# Patient Record
Sex: Female | Born: 1965 | Race: Black or African American | Hispanic: No | Marital: Single | Smoking: Never smoker
Health system: Southern US, Community
[De-identification: ages and names within clinical notes are randomized; demographics above are authoritative.]

## PROBLEM LIST (undated history)

## (undated) DIAGNOSIS — G4733 Obstructive sleep apnea (adult) (pediatric): Secondary | ICD-10-CM

## (undated) DIAGNOSIS — M419 Scoliosis, unspecified: Secondary | ICD-10-CM

## (undated) DIAGNOSIS — Z9989 Dependence on other enabling machines and devices: Secondary | ICD-10-CM

## (undated) DIAGNOSIS — F329 Major depressive disorder, single episode, unspecified: Secondary | ICD-10-CM

## (undated) DIAGNOSIS — J45909 Unspecified asthma, uncomplicated: Secondary | ICD-10-CM

## (undated) DIAGNOSIS — N809 Endometriosis, unspecified: Secondary | ICD-10-CM

## (undated) DIAGNOSIS — R569 Unspecified convulsions: Secondary | ICD-10-CM

## (undated) DIAGNOSIS — Z8619 Personal history of other infectious and parasitic diseases: Secondary | ICD-10-CM

## (undated) DIAGNOSIS — E079 Disorder of thyroid, unspecified: Secondary | ICD-10-CM

## (undated) DIAGNOSIS — K219 Gastro-esophageal reflux disease without esophagitis: Secondary | ICD-10-CM

## (undated) DIAGNOSIS — K635 Polyp of colon: Secondary | ICD-10-CM

## (undated) DIAGNOSIS — E785 Hyperlipidemia, unspecified: Secondary | ICD-10-CM

## (undated) DIAGNOSIS — F419 Anxiety disorder, unspecified: Secondary | ICD-10-CM

## (undated) DIAGNOSIS — I10 Essential (primary) hypertension: Secondary | ICD-10-CM

## (undated) DIAGNOSIS — F32A Depression, unspecified: Secondary | ICD-10-CM

## (undated) HISTORY — DX: Polyp of colon: K63.5

## (undated) HISTORY — PX: OTHER SURGICAL HISTORY: SHX169

## (undated) HISTORY — DX: Endometriosis, unspecified: N80.9

## (undated) HISTORY — DX: Essential (primary) hypertension: I10

## (undated) HISTORY — DX: Obstructive sleep apnea (adult) (pediatric): G47.33

## (undated) HISTORY — DX: Hyperlipidemia, unspecified: E78.5

## (undated) HISTORY — DX: Unspecified asthma, uncomplicated: J45.909

## (undated) HISTORY — DX: Disorder of thyroid, unspecified: E07.9

## (undated) HISTORY — DX: Gastro-esophageal reflux disease without esophagitis: K21.9

## (undated) HISTORY — DX: Unspecified convulsions: R56.9

## (undated) HISTORY — DX: Major depressive disorder, single episode, unspecified: F32.9

## (undated) HISTORY — DX: Anxiety disorder, unspecified: F41.9

## (undated) HISTORY — DX: Depression, unspecified: F32.A

## (undated) HISTORY — DX: Personal history of other infectious and parasitic diseases: Z86.19

## (undated) HISTORY — DX: Dependence on other enabling machines and devices: Z99.89

---

## 1999-10-22 ENCOUNTER — Other Ambulatory Visit: Admission: RE | Admit: 1999-10-22 | Discharge: 1999-10-22 | Payer: Self-pay | Admitting: *Deleted

## 2001-05-20 HISTORY — PX: ABDOMINAL HYSTERECTOMY: SHX81

## 2001-11-09 ENCOUNTER — Other Ambulatory Visit: Admission: RE | Admit: 2001-11-09 | Discharge: 2001-11-09 | Payer: Self-pay | Admitting: *Deleted

## 2001-12-30 ENCOUNTER — Ambulatory Visit (HOSPITAL_COMMUNITY): Admission: RE | Admit: 2001-12-30 | Discharge: 2001-12-30 | Payer: Self-pay | Admitting: *Deleted

## 2002-04-13 ENCOUNTER — Inpatient Hospital Stay (HOSPITAL_COMMUNITY): Admission: RE | Admit: 2002-04-13 | Discharge: 2002-04-16 | Payer: Self-pay | Admitting: *Deleted

## 2002-04-13 ENCOUNTER — Encounter (INDEPENDENT_AMBULATORY_CARE_PROVIDER_SITE_OTHER): Payer: Self-pay | Admitting: *Deleted

## 2006-05-20 HISTORY — PX: BREAST BIOPSY: SHX20

## 2006-06-13 ENCOUNTER — Encounter: Admission: RE | Admit: 2006-06-13 | Discharge: 2006-06-13 | Payer: Self-pay | Admitting: General Surgery

## 2006-06-19 ENCOUNTER — Encounter: Admission: RE | Admit: 2006-06-19 | Discharge: 2006-06-19 | Payer: Self-pay | Admitting: General Surgery

## 2006-06-30 ENCOUNTER — Encounter: Admission: RE | Admit: 2006-06-30 | Discharge: 2006-06-30 | Payer: Self-pay | Admitting: General Surgery

## 2006-06-30 ENCOUNTER — Encounter (INDEPENDENT_AMBULATORY_CARE_PROVIDER_SITE_OTHER): Payer: Self-pay | Admitting: *Deleted

## 2006-07-14 ENCOUNTER — Encounter: Admission: RE | Admit: 2006-07-14 | Discharge: 2006-07-14 | Payer: Self-pay | Admitting: Endocrinology

## 2007-02-03 ENCOUNTER — Encounter: Admission: RE | Admit: 2007-02-03 | Discharge: 2007-02-03 | Payer: Self-pay | Admitting: General Surgery

## 2008-06-23 ENCOUNTER — Encounter: Admission: RE | Admit: 2008-06-23 | Discharge: 2008-06-23 | Payer: Self-pay | Admitting: Emergency Medicine

## 2009-05-20 HISTORY — PX: MASTOIDECTOMY: SHX711

## 2009-05-20 HISTORY — PX: OVARIAN CYST REMOVAL: SHX89

## 2009-05-20 HISTORY — PX: MASTOIDECTOMY: SUR855

## 2009-10-25 ENCOUNTER — Encounter: Admission: RE | Admit: 2009-10-25 | Discharge: 2009-10-25 | Payer: Self-pay | Admitting: Otolaryngology

## 2009-11-03 ENCOUNTER — Encounter: Admission: RE | Admit: 2009-11-03 | Discharge: 2009-11-03 | Payer: Self-pay | Admitting: Otolaryngology

## 2010-06-10 ENCOUNTER — Encounter: Payer: Self-pay | Admitting: Otolaryngology

## 2010-10-05 NOTE — Discharge Summary (Signed)
   NAME:  Karen Foley, LANDGREN                        ACCOUNT NO.:  [id number]   MEDICAL RECORD NO.:  [id number]                   PATIENT TYPE:  INP   LOCATION:  9118                                 FACILITY:  [facility]   PHYSICIAN:  Georgina Peer, M.D.              DATE OF BIRTH:  [birth year]   DATE OF ADMISSION:  04/13/2002  DATE OF DISCHARGE:  04/16/2002                                 DISCHARGE SUMMARY   PREOPERATIVE DIAGNOSES:  1. Endometriosis.  2. Pelvic pain.  3. Chronic pelvic inflammatory disease.   DISCHARGE DIAGNOSES:  1. Endometriosis.  2. Pelvic pain.  3. Chronic pelvic inflammatory disease.  4. Compensated anemia.   BRIEF HISTORY:  A 45 year old black female with a 20-year history of  endometriosis was brought in for a TAHBSO which was performed on April 13, 2002 without incident.  There were extensive abdominal and pelvic  adhesions and extensive enterolysis between the intestinal and pelvic  organs.  There was no intestinal damage.  Postoperatively, the patient did  well.  She had no nausea or vomiting. An Alpha-200 incisional catheter was  placed into the incision and gave catheter analgesia.  She had a T-max of  101 on the evening of surgery but that come down to normal.  Her vital signs  were stable.  Her lungs were clear.  Her abdomen was flat.  She had bowel  sounds present on the first day.  Some serosanguineous drainage from the  incision and vagina resolved within the first day.  Initial hemoglobin on  the first day was 9.7 from 15.6 preop.  On the second day, it was 10.1.  WBC  was 11.0.  Her bladder emptied satisfactorily when the Foley was removed.  She passed flatus on the second day and was advanced to solid food.  By the  third day, she was eating, ambulating, and voiding without difficulty.  Her  Alpha-200 catheter was removed.  She was taking Percocet for pain.   CONDITION ON DISCHARGE:  She was discharged in good condition.  Her staples  were  removed.   DISCHARGE INSTRUCTIONS:  She was given a discharge instruction sheet and  instructions to avoid heavy lifting, any vaginal entrance, and told to call  for appropriate postoperative complications.   FOLLOW UP:  She will be seen in the office in two weeks.                                               Georgina Peer, M.D.    JPN/MEDQ  D:  04/16/2002  T:  04/16/2002  Job:  [id number]

## 2010-10-05 NOTE — Op Note (Signed)
NAME:  Karen Foley, Karen Foley                        ACCOUNT NO.:  [id number]   MEDICAL RECORD NO.:  [id number]                   PATIENT TYPE:  INP   LOCATION:  9198                                 FACILITY:  [facility]   PHYSICIAN:  Georgina Peer, M.D.              DATE OF BIRTH:  April 04, 1966   DATE OF PROCEDURE:  04/13/2002  DATE OF DISCHARGE:                                 OPERATIVE REPORT   PREOPERATIVE DIAGNOSES:  1. Pelvic adhesions.  2. Pelvic pain.  3. History of endometriosis.   POSTOPERATIVE DIAGNOSES:  1. Pelvic adhesions.  2. Pelvic pain.  3. History of endometriosis.   PROCEDURE:  Total abdominal hysterectomy, bilateral salpingo-oophorectomy,  and lysis of adhesions.   SURGEON:  Georgina Peer, M.D.   ASSISTANT:  Rudy Jew. Ashley Royalty, M.D.   ESTIMATED BLOOD LOSS:  500 cc.   ANESTHESIA:  General.   COMPLICATIONS:  None.   FINDINGS:  Extensive adhesions of the transverse colon to the posterior  uterus.  Adhesions of the rectosigmoid to cul-de-sac and ovaries.  Adnexa  adherent to cul-de-sac.   INDICATIONS:  A 45 year old black female, gravida 0, with a 20-year history  of endometriosis, previous bilateral ovarian cystectomies, several  laparoscopies, the last one in August 2003 showing obliteration of the cul-  de-sac with extensive pelvic adhesions binding the ovaries and adnexal  structures into the cul-de-sac. The patient underwent a bowel prep  preoperatively and is presenting for a total abdominal hysterectomy,  bilateral salpingo-oophorectomy.   DESCRIPTION OF PROCEDURE:  She was taken to the operating room.  The  abdomen, perineum, and vagina were prepped and draped, a Foley catheter  placed in her bladder.  A transverse incision was made in the skin over the  previous Pfannenstiel incision.  This was taken down layer by layer into the  peritoneal cavity, which was opened sharply and widened sharply.  Bleeders  were cauterized.  The patient was placed in  Trendelenburg.  Pelvic findings  were as follows:  There was transverse colon adherent to the posterior  fundal area of the uterus.  There was rectosigmoid adherent to the cul-de-  sac and adnexal structures.  The adnexal structures were not immediately  visible.  There were no adhesions to the anterior abdominal wall.  A Balfour  retractor was placed and the upper bowel was packed away.  Using sharp  dissection, the adhesions of the transverse colon were taken off the uterine  fundus.  The adnexal structures were freed from the pelvic sidewall and from  the rectosigmoid, the rectosigmoid freed from the right adnexa and the cul-  de-sac.  There were no major bleeding episodes.  The ureters were identified  bilaterally.  At this point the round ligaments bilaterally were clamped and  cut, suture ligated.  The infundibulopelvic ligaments bilaterally were  identified free from the ureters.  They were clamped, cut, and suture  ligated.  The  adnexal structures were freed from the pelvic sidewall and  bleeders cauterized.  An anterior bladder flap was created, the uterine  vessels identified, skeletonized bilaterally, then cut and suture ligated.  Progressive bites of the broad and cardinal ligaments to the level of the  uterosacrals were taken and the pedicles suture ligated.  The uterosacrals  bilaterally clamped, cut, and suture ligated.  The vagina was entered at  this point, and the cervix was removed with Satinsky scissors.  The vaginal  cuff was sutured in the corners and whipstitched in the middle with the  midportion left open.  Small bleeders were cauterized.  Again the ureters  were identified.  The pelvis was irrigated.  All packs were removed as well  as retractors, the fascia closed with Vicryl suture.  An Alpha-200  subcutaneous pain management pump was placed with two catheters, one coming  in on the right and one on the left.  The area on the right had small  bleeding from the  catheter site, which was controlled with pressure.  Fifty  cubic centimeters of 0.25% Marcaine injected under the skin.  The Alpha  catheters were attached to the pump.  The skin was closed with skin staples.  Sponge, needle, and instrument counts were correct.  The patient received 1  g of Ancef preoperatively and will continue with Ancef for 24 hours.                                                Georgina Peer, M.D.    JPN/MEDQ  D:  04/13/2002  T:  04/13/2002  Job:  [id number]

## 2010-10-05 NOTE — Op Note (Signed)
NAME:  Karen Foley, Karen Foley                        ACCOUNT NO.:  [id number]   MEDICAL RECORD NO.:  [id number]                   PATIENT TYPE:  AMB   LOCATION:  SDC                                  FACILITY:  [facility]   PHYSICIAN:  Georgina Peer, M.D.              DATE OF BIRTH:  March 03, 1966   DATE OF PROCEDURE:  12/30/2001  DATE OF DISCHARGE:                                 OPERATIVE REPORT   PREOPERATIVE DIAGNOSIS:  Dysmenorrhea and endometriosis.   POSTOPERATIVE DIAGNOSES:  1. Dysmenorrhea and endometriosis.  2. Chronic pelvic inflammatory disease and adhesions.   PROCEDURE:  Laparoscopy with lysis of adhesions.   SURGEON:  Georgina Peer, M.D.   ANESTHESIA:  General.   ESTIMATED BLOOD LOSS:  Less than 25 cc.   COMPLICATIONS:  None.   FINDINGS:  Extensive pelvic adhesions involving bowel and pelvic organs and  obliteration of cul-de-sac.   INDICATIONS:  [age]-year-old black female with a history of  endometriosis dating back to 39 recently on Danocrine but with increasing  dysmenorrhea.  The patient is brought in for diagnostic and possible  therapeutic evaluation with laparoscopy.   DESCRIPTION OF PROCEDURE:  After informed consent, the patient was taken to  the operating room and given a general anesthetic, placed in the dorsal  lithotomy position.  The abdomen, perineum and vagina were prepped and  draped and the bladder emptied with the catheter.  Marcaine 0.25% 2 cc were  injected into the subumbilical, suprapubic, and right lower quadrant sites.  After anesthesia, an incision was made subumbilically.  Verres needle placed  and 2.2. liters of carbon dioxide gas insufflated under low pressure  creating a pneumoperitoneum.  Laparoscopic trocar and sleeve were introduced  and under direct vision a suprapubic and right lower quadrant trocar were  introduced.  The following pelvic findings were noted.  There appeared to be  no injury from the laparoscopic trocar  placement.  There were adhesions from  the small and large bowel to the pelvic side wall, posterior uterus, draping  over the ovaries.  The ovaries bilaterally appeared to be densely adherent  into the cul-de-sac.  The right ovary could be identified, the left ovary  could not be identified.  Using a harmonic scalpel, the lesions were lysed  from the posterior small bowel but the those around the cul-de-sac could not  be freed adequately.  The right ovary was freed to a certain extent,  however, could not be completely freed because of involvement over the  ureter and dense adhesions to the pelvic side wall and uterus.  After all  adhesions that could be lysed were lysed,  photodocumentation was taken.  The procedure was terminated.  The ports were  removed via the laparoscope, and the incisions were closed with subcuticular  and interrupted sutures of Dexon.  Sponge, needle and instrument counts were  correct.  The patient received Toradol postoperatively and was  returned to  the recovery area in good condition.                                               Georgina Peer, M.D.    JPN/MEDQ  D:  12/30/2001  T:  12/31/2001  Job:  [id number]

## 2010-10-05 NOTE — H&P (Signed)
   NAME:  Karen Foley, Karen Foley                        ACCOUNT NO.:  [id number]   MEDICAL RECORD NO.:  [id number]                   PATIENT TYPE:  AMB   LOCATION:  SDC                                  [facility]ACILITY:  [facility]   PHYSICIAN:  Georgina Peer, M.D.              DATE OF BIRTH:  [birth year]   DATE OF ADMISSION:  12/30/2001  DATE OF DISCHARGE:                                HISTORY & PHYSICAL   PREOPERATIVE DIAGNOSES:  1. History of endometriosis on Danocrine.  2. Continued dysmenorrhea.  3. Pelvic pain.   HISTORY OF PRESENT ILLNESS:  The patient is a 45 year old African American  female with a known history of endometriosis.  The patient had been on  Danocrine for two years.  She had had diagnosis of endometriosis in the  1990s and a diagnosis of endometriosis initially in 1983 when she was  diagnosed by laparoscopy.  In 1983, she also underwent an exploratory  laparotomy and bilateral ovarian cystectomies.  She has had no further  operative evaluation of this, however, her dysmenorrhea and pain have  worsened and she is admitted for reevaluation by laparoscopy.   PAST MEDICAL HISTORY:  Significant for the above-mentioned endometriosis.  The patient has no ongoing medical history and no other surgical history.  The patient has a remote history of seizures as a child.  She had been on  Yasmin for cycle control.   REVIEW OF SYSTEMS:  Otherwise negative.  The patient is currently not  sexually active.   SOCIAL HISTORY:  The patient is a nonsmoker.   PHYSICAL EXAMINATION:  GENERAL APPEARANCE:  A well-developed African  American female.  WEIGHT: 175.2 pounds.  VITAL SIGNS:  Blood pressure 120/80.  HEENT:  Normal.  NECK:  Without masses.  LUNGS:  Clear.  HEART:  Regular rate and rhythm.  ABDOMEN:  Soft and nontender.  PELVIC:  External genitalia are normal.  Vagina normal.  Cervix normal.  There is tenderness in the adnexal areas, but no mass.   ADMITTING IMPRESSION:  History of  endometriosis with dysmenorrhea.    PLAN:  The patient is scheduled for diagnostic laparoscopy.  The risks and  complications of the procedure were detailed and explained to the patient  and all questions answered.  The surgery is scheduled for 2:45 p.m. at  [facility].                                               Georgina Peer, M.D.    JPN/MEDQ  D:  12/30/2001  T:  12/30/2001  Job:  [id number]   cc:   Day Surgery

## 2010-10-05 NOTE — H&P (Signed)
NAME:  Karen Foley, Karen Foley                        ACCOUNT NO.:  [id number]   MEDICAL RECORD NO.:  [id number]                   PATIENT TYPE:  INP   LOCATION:  NA                                   FACILITY:  [facility]   PHYSICIAN:  Georgina Peer, M.D.              DATE OF BIRTH:  21-May-1965   DATE OF ADMISSION:  04/13/2002  DATE OF DISCHARGE:                                HISTORY & PHYSICAL   ADMISSION DIAGNOSES:  1. Endometriosis.  2. Chronic pelvic inflammatory disease with pelvic adhesions.  3. Dysmenorrhea.   HISTORY OF PRESENT ILLNESS:  The patient is a 45 year old black female with  a history of endometriosis, dating back to 46 when she underwent a  diagnostic laparoscopy.  Since then she has had intermittent treatment with  oral contraceptives and Danocrine and a recent laparoscopy in August of 2003  revealed severe adhesions in the cul-de-sac with involving the bowel and  obliteration of the cul-de-sac.  The patient is brought in for TAH-BSO.   PAST MEDICAL HISTORY:  The patient has never been pregnant.  The patient has  had multiple laparoscopies, bilateral ovarian cystectomies, diagnostic  laparoscopy, and has had a history of childhood seizures, has never had a  sexually transmitted disease.   ALLERGIES:  None.   HABITS:  Cigarettes:  None.  Ethanol:  None.   FAMILY HISTORY:  Not significant for any pelvic disease.  Hypertension in  father and mother.  Mother with mastectomy.  Diabetes in father.   REVIEW OF SYSTEMS:  Otherwise negative.   PHYSICAL EXAMINATION:  VITAL SIGNS:  Blood pressure is 120/80, weight 135.2.  HEENT:  Grossly normal.  NECK:  Without thyromegaly.  LUNGS:  Clear.  HEART:  Regular rate and rhythm.  BREASTS:  Without masses.  ABDOMEN:  Soft with lower abdominal tenderness.  No CVA tenderness.  No  mass.  No guarding or rebound.  EXTERNAL GENITALS:  Vagina and cervix normal.  There is tenderness in the  cul-de-sac and bilaterally in the adnexal  areas.  The uterus is normal size,  shape, and consistency.  PERIPHERAL VASCULAR:  Normal.  EXTREMITIES:  No cyanosis or edema in the extremities.  NEUROLOGICAL:  Grossly normal.   IMPRESSION:  1. Chronic pelvic inflammatory disease.  2. Endometriosis.  3. Unresponsive to conservative management.   RECOMMENDATIONS:  The patient is admitted for total abdominal hysterectomy,  bilateral salpingo-oophorectomy.  A preoperative bowel prep is being  obtained.  The patient is aware that hormone replacement therapy may be  needed if the patient is symptomatic, and that all her pain may not be  completely dissolved by this procedure.  She is still willing to proceed.  She is aware of risks and complications, bowel, bladder, vascular injury,  neurologic injury, blood clots, and wound or vaginal infections.  Georgina Peer, M.D.    JPN/MEDQ  D:  04/12/2002  T:  04/12/2002  Job:  [id number]   cc:   Day Surgery - [facility]

## 2010-11-13 ENCOUNTER — Other Ambulatory Visit: Payer: Self-pay | Admitting: Emergency Medicine

## 2011-06-18 ENCOUNTER — Other Ambulatory Visit: Payer: Self-pay | Admitting: Family Medicine

## 2011-06-18 ENCOUNTER — Other Ambulatory Visit: Payer: Self-pay | Admitting: Physician Assistant

## 2011-07-02 ENCOUNTER — Ambulatory Visit (INDEPENDENT_AMBULATORY_CARE_PROVIDER_SITE_OTHER): Payer: 59 | Admitting: Internal Medicine

## 2011-07-02 VITALS — BP 145/98 | HR 65 | Temp 98.2°F | Resp 16 | Ht 65.5 in | Wt 190.6 lb

## 2011-07-02 DIAGNOSIS — J019 Acute sinusitis, unspecified: Secondary | ICD-10-CM

## 2011-07-02 DIAGNOSIS — R059 Cough, unspecified: Secondary | ICD-10-CM

## 2011-07-02 DIAGNOSIS — J329 Chronic sinusitis, unspecified: Secondary | ICD-10-CM

## 2011-07-02 DIAGNOSIS — IMO0001 Reserved for inherently not codable concepts without codable children: Secondary | ICD-10-CM

## 2011-07-02 DIAGNOSIS — B09 Unspecified viral infection characterized by skin and mucous membrane lesions: Secondary | ICD-10-CM

## 2011-07-02 DIAGNOSIS — R05 Cough: Secondary | ICD-10-CM

## 2011-07-02 DIAGNOSIS — B001 Herpesviral vesicular dermatitis: Secondary | ICD-10-CM

## 2011-07-02 DIAGNOSIS — M791 Myalgia, unspecified site: Secondary | ICD-10-CM

## 2011-07-02 MED ORDER — VALACYCLOVIR HCL 1 G PO TABS: 1000.0000 mg | ORAL_TABLET | Freq: Three times a day (TID) | ORAL | Status: DC

## 2011-07-02 MED ORDER — MELOXICAM 7.5 MG PO TABS: 7.5000 mg | ORAL_TABLET | Freq: Every day | ORAL | Status: DC

## 2011-07-02 MED ORDER — SULFAMETHOXAZOLE-TRIMETHOPRIM 800-160 MG PO TABS: 1.0000 | ORAL_TABLET | Freq: Two times a day (BID) | ORAL | Status: AC

## 2011-07-02 NOTE — Progress Notes (Signed)
  Subjective:    Patient ID: Karen Foley, female    DOB: [birth year], 46 y.o.   MRN: [id number]  HPI body aches, cough, sinus fullness and drainage. No fever. onset 3 days ago. Now has lesion on her upper lip very painful 8/10 no drainage. No stiff neck no headache no radiation of pain from lip    Review of Systems  Constitutional: Positive for fatigue. Negative for fever.  HENT: Positive for rhinorrhea and postnasal drip.   Eyes: Negative.   Respiratory: Positive for cough.   Cardiovascular: Negative.   Gastrointestinal: Negative.   Genitourinary: Negative.   Musculoskeletal: Negative.   Skin: Positive for rash.  Neurological: Negative.   Psychiatric/Behavioral: Negative.   All other systems reviewed and are negative.       Objective:   Physical Exam  Nursing note and vitals reviewed. Constitutional: She is oriented to person, place, and time. She appears well-developed and well-nourished.  HENT:  Head: Normocephalic and atraumatic.  Right Ear: External ear normal.  Left Ear: External ear normal.       Tender over maxillary sinuses  Eyes: Conjunctivae and EOM are normal.  Neck: Normal range of motion. Neck supple.  Cardiovascular: Normal rate, regular rhythm and normal heart sounds.   Pulmonary/Chest: Effort normal.  Abdominal: Soft. Bowel sounds are normal.  Musculoskeletal: Normal range of motion.  Neurological: She is alert and oriented to person, place, and time. She has normal reflexes.  Skin: Rash noted.       Viral lesion to upper lip no drainage  Psychiatric: She has a normal mood and affect. Judgment normal.          Assessment & Plan:  sinusitus Herpes simplex Uncontrolled hypertension Facial pain Plan bactrim ds 1 tab 2 times daily for 10 days. Zovirax as directed.renew meloxicam

## 2011-09-21 ENCOUNTER — Ambulatory Visit (INDEPENDENT_AMBULATORY_CARE_PROVIDER_SITE_OTHER): Payer: 59 | Admitting: Emergency Medicine

## 2011-09-21 ENCOUNTER — Ambulatory Visit: Payer: 59

## 2011-09-21 VITALS — BP 142/98 | HR 90 | Temp 98.5°F | Resp 16 | Ht 65.5 in | Wt 190.0 lb

## 2011-09-21 DIAGNOSIS — M25529 Pain in unspecified elbow: Secondary | ICD-10-CM

## 2011-09-21 MED ORDER — NABUMETONE 750 MG PO TABS: 750.0000 mg | ORAL_TABLET | Freq: Two times a day (BID) | ORAL | Status: DC

## 2011-09-21 NOTE — Progress Notes (Signed)
  Subjective:    Patient ID: Karen Foley, female    DOB: [birth year], 46 y.o.   MRN: [id number]  HPI patient states she has to do a lot of repetitive work with her right elbow. She says she uses her right  hand and arm repetitively at work. 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 The lateral portion of the elbow.    Review of Systems noncontributory except as relates to this illness.     Objective:   Physical Exam patient has exquisite tenderness over the lateral epicondyle. There is significant pain with extension of the wrist against resistance. Neurovascular exam is intact.   [facility] reading (PRIMARY) by  Dr.  Cleta Alberts no fracture seen.      Assessment & Plan:  Right elbow pain suspect secondary to lateral epicondylitis. We'll check films of the elbow.

## 2011-09-21 NOTE — Patient Instructions (Signed)
Please be sure you stop the Mobic., Meloxicam. I want you to take Relafen 750 twice a day instead. I have called in a prescription for relafen.Lateral Epicondylitis (Tennis Elbow) with Rehab Lateral epicondylitis involves inflammation and pain around the outer portion of the elbow. The pain is caused by inflammation of the tendons in the forearm that bring back (extend) the wrist. Lateral epicondylittis is also called tennis elbow, because it is very common in tennis players. However, it may occur in any individual who extends the wrist repetitively. If lateral epicondylitis is left untreated, it may become a chronic problem. SYMPTOMS   Pain, tenderness, and inflammation on the outer (lateral) side of the elbow.   Pain or weakness with gripping activities.   Pain that increases with wrist twisting motions (playing tennis, using a screwdriver, opening a door or a jar).   Pain with lifting objects, including a coffee cup.  CAUSES  Lateral epicondylitis is caused by inflammation of the tendons that extend the wrist. Causes of injury may include:  Repetitive stress and strain on the muscles and tendons that extend the wrist.   Sudden change in activity level or intensity.   Incorrect grip in racquet sports.   Incorrect grip size of racquet (often too large).   Incorrect hitting position or technique (usually backhand, leading with the elbow).   Using a racket that is too heavy.  RISK INCREASES WITH:  Sports or occupations that require repetitive and/or strenuous forearm and wrist movements (tennis, squash, racquetball, carpentry).   Poor wrist and forearm strength and flexibility.   Failure to warm up properly before activity.   Resuming activity before healing, rehabilitation, and conditioning are complete.  PREVENTION   Warm up and stretch properly before activity.   Maintain physical fitness:   Strength, flexibility, and endurance.   Cardiovascular fitness.   Wear and use  properly fitted equipment.   Learn and use proper technique and have a coach correct improper technique.   Wear a tennis elbow (counterforce) brace.  PROGNOSIS  The course of this condition depends on the degree of the injury. If treated properly, acute cases (symptoms lasting less than 4 weeks) are often resolved in 2 to 6 weeks. Chronic (longer lasting cases) often resolve in 3 to 6 months, but may require physical therapy. RELATED COMPLICATIONS   Frequently recurring symptoms, resulting in a chronic problem. Properly treating the problem the first time decreases frequency of recurrence.   Chronic inflammation, scarring tendon degeneration, and partial tendon tear, requiring surgery.   Delayed healing or resolution of symptoms.  TREATMENT  Treatment first involves the use of ice and medicine, to reduce pain and inflammation. Strengthening and stretching exercises may help reduce discomfort, if performed regularly. These exercises may be performed at home, if the condition is an acute injury. Chronic cases may require a referral to a physical therapist for evaluation and treatment. Your caregiver may advise a corticosteroid injection, to help reduce inflammation. Rarely, surgery is needed. MEDICATION  If pain medicine is needed, nonsteroidal anti-inflammatory medicines (aspirin and ibuprofen), or other minor pain relievers (acetaminophen), are often advised.   Do not take pain medicine for 7 days before surgery.   Prescription pain relievers may be given, if your caregiver thinks they are needed. Use only as directed and only as much as you need.   Corticosteroid injections may be recommended. These injections should be reserved only for the most severe cases, because they can only be given a certain number of times.  HEAT AND COLD  Cold treatment (icing) should be applied for 10 to 15 minutes every 2 to 3 hours for inflammation and pain, and immediately after activity that aggravates  your symptoms. Use ice packs or an ice massage.   Heat treatment may be used before performing stretching and strengthening activities prescribed by your caregiver, physical therapist, or athletic trainer. Use a heat pack or a warm water soak.  SEEK MEDICAL CARE IF: Symptoms get worse or do not improve in 2 weeks, despite treatment. EXERCISES  RANGE OF MOTION (ROM) AND STRETCHING EXERCISES - Epicondylitis, Lateral (Tennis Elbow) These exercises may help you when beginning to rehabilitate your injury. Your symptoms may go away with or without further involvement from your physician, physical therapist or athletic trainer. While completing these exercises, remember:   Restoring tissue flexibility helps normal motion to return to the joints. This allows healthier, less painful movement and activity.   An effective stretch should be held for at least 30 seconds.   A stretch should never be painful. You should only feel a gentle lengthening or release in the stretched tissue.  RANGE OF MOTION - Wrist Flexion, Active-Assisted  Extend your right / left elbow with your fingers pointing down.*   Gently pull the back of your hand towards you, until you feel a gentle stretch on the top of your forearm.   Hold this position for __________ seconds.  Repeat __________ times. Complete this exercise __________ times per day.  *If directed by your physician, physical therapist or athletic trainer, complete this stretch with your elbow bent, rather than extended. RANGE OF MOTION - Wrist Extension, Active-Assisted  Extend your right / left elbow and turn your palm upwards.*   Gently pull your palm and fingertips back, so your wrist extends and your fingers point more toward the ground.   You should feel a gentle stretch on the inside of your forearm.   Hold this position for __________ seconds.  Repeat __________ times. Complete this exercise __________ times per day. *If directed by your physician,  physical therapist or athletic trainer, complete this stretch with your elbow bent, rather than extended. STRETCH - Wrist Flexion  Place the back of your right / left hand on a tabletop, leaving your elbow slightly bent. Your fingers should point away from your body.   Gently press the back of your hand down onto the table by straightening your elbow. You should feel a stretch on the top of your forearm.   Hold this position for __________ seconds.  Repeat __________ times. Complete this stretch __________ times per day.  STRETCH - Wrist Extension   Place your right / left fingertips on a tabletop, leaving your elbow slightly bent. Your fingers should point backwards.   Gently press your fingers and palm down onto the table by straightening your elbow. You should feel a stretch on the inside of your forearm.   Hold this position for __________ seconds.  Repeat __________ times. Complete this stretch __________ times per day.  STRENGTHENING EXERCISES - Epicondylitis, Lateral (Tennis Elbow) These exercises may help you when beginning to rehabilitate your injury. They may resolve your symptoms with or without further involvement from your physician, physical therapist or athletic trainer. While completing these exercises, remember:   Muscles can gain both the endurance and the strength needed for everyday activities through controlled exercises.   Complete these exercises as instructed by your physician, physical therapist or athletic trainer. Increase the resistance and repetitions only as guided.  You may experience muscle soreness or fatigue, but the pain or discomfort you are trying to eliminate should never worsen during these exercises. If this pain does get worse, stop and make sure you are following the directions exactly. If the pain is still present after adjustments, discontinue the exercise until you can discuss the trouble with your caregiver.  STRENGTH - Wrist Flexors  Sit with  your right / left forearm palm-up and fully supported on a table or countertop. Your elbow should be resting below the height of your shoulder. Allow your wrist to extend over the edge of the surface.   Loosely holding a __________ weight, or a piece of rubber exercise band or tubing, slowly curl your hand up toward your forearm.   Hold this position for __________ seconds. Slowly lower the wrist back to the starting position in a controlled manner.  Repeat __________ times. Complete this exercise __________ times per day.  STRENGTH - Wrist Extensors  Sit with your right / left forearm palm-down and fully supported on a table or countertop. Your elbow should be resting below the height of your shoulder. Allow your wrist to extend over the edge of the surface.   Loosely holding a __________ weight, or a piece of rubber exercise band or tubing, slowly curl your hand up toward your forearm.   Hold this position for __________ seconds. Slowly lower the wrist back to the starting position in a controlled manner.  Repeat __________ times. Complete this exercise __________ times per day.  STRENGTH - Ulnar Deviators  Stand with a ____________________ weight in your right / left hand, or sit while holding a rubber exercise band or tubing, with your healthy arm supported on a table or countertop.   Move your wrist, so that your pinkie travels toward your forearm and your thumb moves away from your forearm.   Hold this position for __________ seconds and then slowly lower the wrist back to the starting position.  Repeat __________ times. Complete this exercise __________ times per day STRENGTH - Radial Deviators  Stand with a ____________________ weight in your right / left hand, or sit while holding a rubber exercise band or tubing, with your injured arm supported on a table or countertop.   Raise your hand upward in front of you or pull up on the rubber tubing.   Hold this position for __________  seconds and then slowly lower the wrist back to the starting position.  Repeat __________ times. Complete this exercise __________ times per day. STRENGTH - Forearm Supinators   Sit with your right / left forearm supported on a table, keeping your elbow below shoulder height. Rest your hand over the edge, palm down.   Gently grip a hammer or a soup ladle.   Without moving your elbow, slowly turn your palm and hand upward to a "thumbs-up" position.   Hold this position for __________ seconds. Slowly return to the starting position.  Repeat __________ times. Complete this exercise __________ times per day.  STRENGTH - Forearm Pronators   Sit with your right / left forearm supported on a table, keeping your elbow below shoulder height. Rest your hand over the edge, palm up.   Gently grip a hammer or a soup ladle.   Without moving your elbow, slowly turn your palm and hand upward to a "thumbs-up" position.   Hold this position for __________ seconds. Slowly return to the starting position.  Repeat __________ times. Complete this exercise __________ times per day.  STRENGTH - Grip  Grasp a tennis ball, a dense sponge, or a large, rolled sock in your hand.   Squeeze as hard as you can, without increasing any pain.   Hold this position for __________ seconds. Release your grip slowly.  Repeat __________ times. Complete this exercise __________ times per day.  STRENGTH - Elbow Extensors, Isometric  Stand or sit upright, on a firm surface. Place your right / left arm so that your palm faces your stomach, and it is at the height of your waist.   Place your opposite hand on the underside of your forearm. Gently push up as your right / left arm resists. Push as hard as you can with both arms, without causing any pain or movement at your right / left elbow. Hold this stationary position for __________ seconds.  Gradually release the tension in both arms. Allow your muscles to relax completely  before repeating. Document Released: 05/06/2005 Document Revised: 04/25/2011 Document Reviewed: 08/18/2008 [facility] Patient Information 2012 [place], [place].

## 2011-10-29 ENCOUNTER — Other Ambulatory Visit: Payer: Self-pay | Admitting: Physician Assistant

## 2011-12-23 ENCOUNTER — Other Ambulatory Visit: Payer: Self-pay | Admitting: Emergency Medicine

## 2012-01-02 ENCOUNTER — Ambulatory Visit (INDEPENDENT_AMBULATORY_CARE_PROVIDER_SITE_OTHER): Payer: 59 | Admitting: Family Medicine

## 2012-01-02 VITALS — BP 150/100 | HR 95 | Temp 98.6°F | Resp 14 | Ht 65.0 in | Wt 204.0 lb

## 2012-01-02 DIAGNOSIS — F32A Depression, unspecified: Secondary | ICD-10-CM

## 2012-01-02 DIAGNOSIS — F329 Major depressive disorder, single episode, unspecified: Secondary | ICD-10-CM

## 2012-01-02 DIAGNOSIS — I10 Essential (primary) hypertension: Secondary | ICD-10-CM

## 2012-01-02 DIAGNOSIS — R200 Anesthesia of skin: Secondary | ICD-10-CM

## 2012-01-02 DIAGNOSIS — F439 Reaction to severe stress, unspecified: Secondary | ICD-10-CM

## 2012-01-02 DIAGNOSIS — F3289 Other specified depressive episodes: Secondary | ICD-10-CM

## 2012-01-02 DIAGNOSIS — Z733 Stress, not elsewhere classified: Secondary | ICD-10-CM

## 2012-01-02 DIAGNOSIS — E119 Type 2 diabetes mellitus without complications: Secondary | ICD-10-CM

## 2012-01-02 DIAGNOSIS — R202 Paresthesia of skin: Secondary | ICD-10-CM

## 2012-01-02 DIAGNOSIS — R209 Unspecified disturbances of skin sensation: Secondary | ICD-10-CM

## 2012-01-02 LAB — POCT CBC
Granulocyte percent: 66.1 % (ref 37–80)
HCT, POC: 41.5 % (ref 37.7–47.9)
Hemoglobin: 12.8 g/dL (ref 12.2–16.2)
Lymph, poc: 2.9 (ref 0.6–3.4)
MCH, POC: 29 pg (ref 27–31.2)
MCHC: 30.8 g/dL — AB (ref 31.8–35.4)
MCV: 94 fL (ref 80–97)
MID (cbc): 0.6 (ref 0–0.9)
MPV: 10.3 fL (ref 0–99.8)
POC Granulocyte: 6.8 (ref 2–6.9)
POC LYMPH PERCENT: 27.8 % (ref 10–50)
POC MID %: 6.1 % (ref 0–12)
Platelet Count, POC: 225 10*3/uL (ref 142–424)
RBC: 4.42 M/uL (ref 4.04–5.48)
RDW, POC: 14.9 %
WBC: 10.3 10*3/uL — AB (ref 4.6–10.2)

## 2012-01-02 LAB — GLUCOSE, POCT (MANUAL RESULT ENTRY): POC Glucose: 99 mg/dL (ref 70–99)

## 2012-01-02 LAB — POCT GLYCOSYLATED HEMOGLOBIN (HGB A1C): Hemoglobin A1C: 7.2

## 2012-01-02 MED ORDER — CITALOPRAM HYDROBROMIDE 20 MG PO TABS: 20.0000 mg | ORAL_TABLET | Freq: Every day | ORAL | Status: DC

## 2012-01-02 NOTE — Progress Notes (Signed)
[facility]:  Office Visit  Chief Complaint:  Chief Complaint  Patient presents with  . Extremity Weakness    and facial spasms since 2pm today  . Knee Pain    right knee x 1 week    HPI: Karen Foley is a 46 y.o. female who complains of MMP   Patient was at work having lunch and started having bilateral leg weakness ( chronic issue but acute episode today) associated with spasms around her mouth. She denies vision changes, confusion, CP, SOB, diaphoresis, assymeteric weakness.   1. HTN-was in the 150s/90s at work when it was taken but higher here in office. She has frontal HA. Denies  vision problems, slurred speech, or other CVA/TIA sxs.  2. Peri-oral  muscles spasm started  today , 5 x at work and 2x in our office, lasts for about less than 1 minute.  3. Weakness in the legs bilaterally, similar to Charley horse from hip all the way down to feet bilaterally. She has had this before. Patient states that her diabetes is well controlled. Denies any hypoglycemia.  4. 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 She was on Celexa but took herself off of it several months ago. She states it worked but thought she did not need it any longer.   Past Medical History  Diagnosis Date  . Diabetes mellitus   . Hyperlipidemia   . Hypertension   . Depression    History reviewed. No pertinent past surgical history. History   Social History  . Marital Status: Single    Spouse Name: N/A    Number of Children: N/A  . Years of Education: N/A   Social History Main Topics  . Smoking status: Never Smoker   . Smokeless tobacco: None  . Alcohol Use: None  . Drug Use: None  . Sexually Active: None   Other Topics Concern  . None   Social History Narrative  . None   Family History  Problem Relation Age of Onset  . Cancer Mother   . Cancer Father    No Known Allergies Prior to Admission medications    Medication Sig Start Date End Date Taking? Authorizing Provider  glipiZIDE (GLUCOTROL XL) 5 MG 24 hr tablet Take 1 tablet (5 mg total) by mouth daily. NEEDS OFFICE VISIT/LABS 12/23/11  Yes Heather M Marte, PA-C  lisinopril (PRINIVIL,ZESTRIL) 10 MG tablet Take 1 tablet (10 mg total) by mouth daily. NEEDS OFFICE VISIT/LABS FOR MORE 12/23/11  Yes Heather M Marte, PA-C  nabumetone (RELAFEN) 750 MG tablet Take 1 tablet (750 mg total) by mouth 2 (two) times daily. 09/21/11 09/20/12 Yes Collene Gobble, MD  pravastatin (PRAVACHOL) 40 MG tablet Take 1 tablet (40 mg total) by mouth daily. NEEDS OFFICE VISIT/LABS 12/23/11  Yes Heather M Marte, PA-C  citalopram (CELEXA) 20 MG tablet Take 20 mg by mouth. Take 1 and 1/2 tab poqd    Historical Provider, MD     ROS: The patient denies fevers, chills, night sweats, unintentional weight loss, chest pain, palpitations, wheezing, dyspnea on exertion, nausea, vomiting, abdominal pain, dysuria, hematuria, melena,+ numbness, weakness, or tingling.   All other systems have been reviewed and were otherwise negative with the exception of those mentioned in the HPI and as above.    PHYSICAL EXAM: Filed Vitals:   01/02/12 1638  BP: 150/100  Pulse: 95  Temp: 98.6 F (37 C)  Resp: 14  Filed Vitals:   01/02/12 1638  Height: 5\' 5"  (1.651 m)  Weight: 204 lb (92.534 kg)   Body mass index is 33.95 kg/(m^2).  General: Alert, no acute distress,slightly misty eyed during conversation HEENT:  Normocephalic, atraumatic, oropharynx patent. EOMI, PERRLA, fundoscopic exam nl Cardiovascular:  Regular rate and rhythm, no rubs murmurs or gallops.  No Carotid bruits, radial pulse intact. No pedal edema.  Respiratory: Clear to auscultation bilaterally.  No wheezes, rales, or rhonchi.  No cyanosis, no use of accessory musculature GI: No organomegaly, abdomen is soft and non-tender, positive bowel sounds.  No masses. Skin: No rashes. Neurologic: Facial musculature symmetric. CN 2-12  grossly intact. Romberg neg. No CVA like signs Psychiatric: Patient is appropriate throughout our interaction. Lymphatic: No cervical lymphadenopathy Musculoskeletal: Gait intact.   LABS: Results for orders placed in visit on 01/02/12  POCT CBC      Component Value Range   WBC 10.3 (*) 4.6 - 10.2 K/uL   Lymph, poc 2.9  0.6 - 3.4   POC LYMPH PERCENT 27.8  10 - 50 %L   MID (cbc) 0.6  0 - 0.9   POC MID % 6.1  0 - 12 %M   POC Granulocyte 6.8  2 - 6.9   Granulocyte percent 66.1  37 - 80 %G   RBC 4.42  4.04 - 5.48 M/uL   Hemoglobin 12.8  12.2 - 16.2 g/dL   HCT, POC 96.0  45.4 - 47.9 %   MCV 94.0  80 - 97 fL   MCH, POC 29.0  27 - 31.2 pg   MCHC 30.8 (*) 31.8 - 35.4 g/dL   RDW, POC 09.8     Platelet Count, POC 225  142 - 424 K/uL   MPV 10.3  0 - 99.8 fL  POCT GLYCOSYLATED HEMOGLOBIN (HGB A1C)      Component Value Range   Hemoglobin A1C 7.2    GLUCOSE, POCT (MANUAL RESULT ENTRY)      Component Value Range   POC Glucose 99  70 - 99 mg/dl     EKG/XRAY:   Primary read interpreted by Dr. Conley Rolls at [facility].   ASSESSMENT/PLAN: Encounter Diagnoses  Name Primary?  . HTN (hypertension) Yes  . Stress   . Numbness and tingling   . Diabetes mellitus   . Depression    1. Increase Lisinopril from 10 to 20 mg daily. Take 2 tabs of Lisinopril 10 mg daily, if BP continues to be > 140/90 return for f/u 2. Refilled Celexa.  3. Pening labs: TSH, , CMP 4. Patient is stress at work, BP is high , she is getting peri-oral msk twitches from what it sounds like. Will give her a wellness day off for 01/03/2012. Patient to resume Celexa.    LE, THAO PHUONG, DO 01/03/2012 10:51 AM

## 2012-01-03 LAB — COMPREHENSIVE METABOLIC PANEL WITH GFR
ALT: 23 U/L (ref 0–35)
BUN: 15 mg/dL (ref 6–23)
Calcium: 9.2 mg/dL (ref 8.4–10.5)
Creat: 1.06 mg/dL (ref 0.50–1.10)
Sodium: 141 meq/L (ref 135–145)

## 2012-01-03 LAB — COMPREHENSIVE METABOLIC PANEL
AST: 15 U/L (ref 0–37)
Albumin: 4.3 g/dL (ref 3.5–5.2)
Alkaline Phosphatase: 94 U/L (ref 39–117)
CO2: 27 mEq/L (ref 19–32)
Chloride: 105 mEq/L (ref 96–112)
Glucose, Bld: 106 mg/dL — ABNORMAL HIGH (ref 70–99)
Potassium: 4 mEq/L (ref 3.5–5.3)
Total Bilirubin: 0.3 mg/dL (ref 0.3–1.2)
Total Protein: 7.1 g/dL (ref 6.0–8.3)

## 2012-01-03 LAB — TSH: TSH: 0.907 u[IU]/mL (ref 0.350–4.500)

## 2012-01-21 ENCOUNTER — Encounter: Payer: Self-pay | Admitting: Family Medicine

## 2012-02-04 ENCOUNTER — Other Ambulatory Visit: Payer: Self-pay | Admitting: Emergency Medicine

## 2012-02-04 ENCOUNTER — Ambulatory Visit (INDEPENDENT_AMBULATORY_CARE_PROVIDER_SITE_OTHER): Payer: 59 | Admitting: Emergency Medicine

## 2012-02-04 ENCOUNTER — Encounter: Payer: Self-pay | Admitting: Emergency Medicine

## 2012-02-04 VITALS — BP 150/108 | HR 66 | Temp 98.2°F | Resp 16 | Ht 65.0 in | Wt 196.8 lb

## 2012-02-04 DIAGNOSIS — F329 Major depressive disorder, single episode, unspecified: Secondary | ICD-10-CM

## 2012-02-04 DIAGNOSIS — I10 Essential (primary) hypertension: Secondary | ICD-10-CM

## 2012-02-04 DIAGNOSIS — G4733 Obstructive sleep apnea (adult) (pediatric): Secondary | ICD-10-CM

## 2012-02-04 DIAGNOSIS — Z9989 Dependence on other enabling machines and devices: Secondary | ICD-10-CM | POA: Insufficient documentation

## 2012-02-04 DIAGNOSIS — E785 Hyperlipidemia, unspecified: Secondary | ICD-10-CM

## 2012-02-04 DIAGNOSIS — Z23 Encounter for immunization: Secondary | ICD-10-CM

## 2012-02-04 DIAGNOSIS — Z139 Encounter for screening, unspecified: Secondary | ICD-10-CM

## 2012-02-04 DIAGNOSIS — R202 Paresthesia of skin: Secondary | ICD-10-CM

## 2012-02-04 DIAGNOSIS — Z0000 Encounter for general adult medical examination without abnormal findings: Secondary | ICD-10-CM

## 2012-02-04 DIAGNOSIS — G514 Facial myokymia: Secondary | ICD-10-CM

## 2012-02-04 DIAGNOSIS — E119 Type 2 diabetes mellitus without complications: Secondary | ICD-10-CM

## 2012-02-04 DIAGNOSIS — F32A Depression, unspecified: Secondary | ICD-10-CM

## 2012-02-04 DIAGNOSIS — Z1231 Encounter for screening mammogram for malignant neoplasm of breast: Secondary | ICD-10-CM

## 2012-02-04 LAB — POCT URINALYSIS DIPSTICK
Glucose, UA: NEGATIVE
Ketones, UA: NEGATIVE
Leukocytes, UA: NEGATIVE

## 2012-02-04 LAB — POCT UA - MICROSCOPIC ONLY

## 2012-02-04 LAB — LIPID PANEL
HDL: 46 mg/dL (ref >39)
LDL Cholesterol: 151 mg/dL — ABNORMAL HIGH (ref 0–99)
Total CHOL/HDL Ratio: 4.6 Ratio

## 2012-02-04 LAB — CK: Total CK: 219 U/L — ABNORMAL HIGH (ref 7–177)

## 2012-02-04 LAB — SEDIMENTATION RATE: Sed Rate: 7 mm/hr (ref 0–22)

## 2012-02-04 MED ORDER — PRAVASTATIN SODIUM 40 MG PO TABS: 40.0000 mg | ORAL_TABLET | Freq: Every day | ORAL | Status: DC

## 2012-02-04 MED ORDER — METFORMIN HCL ER (MOD) 500 MG PO TB24: 500.0000 mg | ORAL_TABLET | Freq: Every day | ORAL | Status: DC

## 2012-02-04 MED ORDER — LOSARTAN POTASSIUM 50 MG PO TABS: 50.0000 mg | ORAL_TABLET | Freq: Every day | ORAL | Status: DC

## 2012-02-04 MED ORDER — CITALOPRAM HYDROBROMIDE 20 MG PO TABS: ORAL_TABLET | ORAL | Status: DC

## 2012-02-04 NOTE — Progress Notes (Signed)
  Subjective:    Patient ID: Karen Foley, female    DOB: [birth year], 46 y.o.   MRN: [id number]  HPI    Review of Systems  Constitutional: Positive for fatigue.  HENT: Positive for congestion, neck stiffness and tinnitus.   Eyes: Positive for itching.  Respiratory: Positive for apnea, cough and choking.   Cardiovascular: Negative.   Gastrointestinal: Positive for nausea and constipation.  Genitourinary: Negative.   Musculoskeletal: Positive for joint swelling.  Skin: Positive for rash.  Neurological: Positive for light-headedness.  Hematological: Negative.   Psychiatric/Behavioral: The patient is nervous/anxious.        Objective:   Physical Exam HEENT exam is unremarkable. Neck is supple without thyromegaly. Chest is clear to auscultation and percussion. Cardiac exam is without murmurs rubs or gallops. Abdomen is soft and nontender. Neurological reveals cranial nerves II through XII intact. Motor strength is 5 out of 5 all muscle groups. Sensory exam is normal  EKG normal sinus rhythm T wave inversion V1 and V2      Assessment & Plan:  Patient is having unusual symptoms with tingling sensation in her fingers and around her mouth. She is on oral hypoglycemics so these will be stopped and she will placed on Glucophage 500. I've arranged for her to have a CT of the head and a neurological consult. I scheduled a sleep study in that she has been on CPAP for 8 years without reevaluation. She was also scheduled for a mammogram in that she is behind on these tests. I changed her lisinopril to losartan just in case she does have an unusual allergic type reaction but I think this is very unlikely.

## 2012-02-05 LAB — T4, FREE: Free T4: 1.2 ng/dL (ref 0.80–1.80)

## 2012-02-05 NOTE — Telephone Encounter (Signed)
Deny vs. Ok x 1, needs OV for more.Marland KitchenMarland Kitchen

## 2012-02-06 ENCOUNTER — Encounter: Payer: Self-pay | Admitting: Radiology

## 2012-02-06 MED ORDER — ROSUVASTATIN CALCIUM 10 MG PO TABS: 10.0000 mg | ORAL_TABLET | Freq: Every day | ORAL | Status: DC

## 2012-02-06 NOTE — Addendum Note (Signed)
Addended by: Marinus Maw on: 02/06/2012 02:59 PM   Modules accepted: Orders

## 2012-02-13 ENCOUNTER — Ambulatory Visit
Admission: RE | Admit: 2012-02-13 | Discharge: 2012-02-13 | Disposition: A | Discharge status: Home / Self Care | Payer: 59 | Source: Ambulatory Visit | Attending: Emergency Medicine | Admitting: Emergency Medicine

## 2012-02-13 DIAGNOSIS — G514 Facial myokymia: Secondary | ICD-10-CM

## 2012-02-20 ENCOUNTER — Ambulatory Visit
Admission: RE | Admit: 2012-02-20 | Discharge: 2012-02-20 | Disposition: A | Discharge status: Home / Self Care | Payer: 59 | Source: Ambulatory Visit | Attending: Emergency Medicine | Admitting: Emergency Medicine

## 2012-02-20 DIAGNOSIS — Z1231 Encounter for screening mammogram for malignant neoplasm of breast: Secondary | ICD-10-CM

## 2012-02-22 ENCOUNTER — Other Ambulatory Visit: Payer: Self-pay | Admitting: Physician Assistant

## 2012-03-05 ENCOUNTER — Telehealth: Payer: Self-pay

## 2012-03-05 MED ORDER — METFORMIN HCL 500 MG PO TABS: 500.0000 mg | ORAL_TABLET | Freq: Two times a day (BID) | ORAL | Status: DC

## 2012-03-05 NOTE — Telephone Encounter (Signed)
Rx changed to generic metformin, not ER, 500 mg 1 PO BID.

## 2012-03-05 NOTE — Telephone Encounter (Signed)
THIS CALL WAS FROM [facility] ON [address]. DR. Cleta Alberts WROTE A PRESCRIPTION FOR GLUMETZA 500MG  24 HOUR RELEASE. THIS PATIENT HAS [employer] AND HER INSURANCE COMPANY DOES NOT PAY FOR IT. THIS MEDICATION WILL COST HER $193.00. THEY WILL HOWEVER, COVER THE PLAIN METFORMIN 500MG   IT WILL ONLY COST HER $2.33. CAN IT BE CHANGED?   [facility] [address] [phone].  MBC

## 2012-07-29 ENCOUNTER — Ambulatory Visit (INDEPENDENT_AMBULATORY_CARE_PROVIDER_SITE_OTHER): Payer: 59 | Admitting: Emergency Medicine

## 2012-07-29 VITALS — BP 170/104 | HR 77 | Temp 98.8°F | Resp 16 | Ht 65.0 in | Wt 189.0 lb

## 2012-07-29 DIAGNOSIS — R197 Diarrhea, unspecified: Secondary | ICD-10-CM

## 2012-07-29 DIAGNOSIS — K5900 Constipation, unspecified: Secondary | ICD-10-CM

## 2012-07-29 LAB — POCT CBC
Granulocyte percent: 61.5 %G (ref 37–80)
MID (cbc): 0.7 (ref 0–0.9)
MPV: 10.1 fL (ref 0–99.8)
POC Granulocyte: 6.7 (ref 2–6.9)
POC MID %: 6.5 %M (ref 0–12)
Platelet Count, POC: 256 10*3/uL (ref 142–424)
RBC: 4.69 M/uL (ref 4.04–5.48)

## 2012-07-29 MED ORDER — POLYETHYLENE GLYCOL 3350 17 GM/SCOOP PO POWD: 17.0000 g | Freq: Every day | ORAL | Status: DC

## 2012-07-29 NOTE — Patient Instructions (Addendum)
Diarrhea Infections caused by germs (bacterial) or a virus commonly cause diarrhea. Your caregiver has determined that with time, rest and fluids, the diarrhea should improve. In general, eat normally while drinking more water than usual. Although water may prevent dehydration, it does not contain salt and minerals (electrolytes). Broths, weak tea without caffeine and oral rehydration solutions (ORS) replace fluids and electrolytes. Small amounts of fluids should be taken frequently. Large amounts at one time may not be tolerated. Plain water may be harmful in infants and the elderly. Oral rehydrating solutions (ORS) are available at pharmacies and grocery stores. ORS replace water and important electrolytes in proper proportions. Sports drinks are not as effective as ORS and may be harmful due to sugars worsening diarrhea.  ORS is especially recommended for use in children with diarrhea. As a general guideline for children, replace any new fluid losses from diarrhea and/or vomiting with ORS as follows:  If your child weighs 22 pounds or under (10 kg or less), give 60-120 mL ( -  cup or 2 - 4 ounces) of ORS for each episode of diarrheal stool or vomiting episode.  If your child weighs more than 22 pounds (more than 10 kgs), give 120-240 mL ( - 1 cup or 4 - 8 ounces) of ORS for each diarrheal stool or episode of vomiting.  While correcting for dehydration, children should eat normally. However, foods high in sugar should be avoided because this may worsen diarrhea. Large amounts of carbonated soft drinks, juice, gelatin desserts and other highly sugared drinks should be avoided.  After correction of dehydration, other liquids that are appealing to the child may be added. Children should drink small amounts of fluids frequently and fluids should be increased as tolerated. Children should drink enough fluids to keep urine clear or pale yellow.  Adults should eat normally while drinking more fluids than  usual. Drink small amounts of fluids frequently and increase as tolerated. Drink enough fluids to keep urine clear or pale yellow. Broths, weak decaffeinated tea, lemon lime soft drinks (allowed to go flat) and ORS replace fluids and electrolytes.  Avoid:  Carbonated drinks.  Juice.  Extremely hot or cold fluids.  Caffeine drinks.  Fatty, greasy foods.  Alcohol.  Tobacco.  Too much intake of anything at one time.  Gelatin desserts.  Probiotics are active cultures of beneficial bacteria. They may lessen the amount and number of diarrheal stools in adults. Probiotics can be found in yogurt with active cultures and in supplements.  Wash hands well to avoid spreading bacteria and virus.  Anti-diarrheal medications are not recommended for infants and children.  Only take over-the-counter or prescription medicines for pain, discomfort or fever as directed by your caregiver. Do not give aspirin to children because it may cause Reye's Syndrome.  For adults, ask your caregiver if you should continue all prescribed and over-the-counter medicines.  If your caregiver has given you a follow-up appointment, it is very important to keep that appointment. Not keeping the appointment could result in a chronic or permanent injury, and disability. If there is any problem keeping the appointment, you must call back to this facility for assistance. SEEK IMMEDIATE MEDICAL CARE IF:   You or your child is unable to keep fluids down or other symptoms or problems become worse in spite of treatment.  Vomiting or diarrhea develops and becomes persistent.  There is vomiting of blood or bile (green material).  There is blood in the stool or the stools are black and   tarry.  There is no urine output in 6-8 hours or there is only a small amount of very dark urine.  Abdominal pain develops, increases or localizes.  You have a fever.  Your baby is older than 3 months with a rectal temperature of 102 F  (38.9 C) or higher.  Your baby is 3 months old or younger with a rectal temperature of 100.4 F (38 C) or higher.  You or your child develops excessive weakness, dizziness, fainting or extreme thirst.  You or your child develops a rash, stiff neck, severe headache or become irritable or sleepy and difficult to awaken. MAKE SURE YOU:   Understand these instructions.  Will watch your condition.  Will get help right away if you are not doing well or get worse. Document Released: 04/26/2002 Document Revised: 07/29/2011 Document Reviewed: 03/13/2009 [employer] Patient Information 2013 [employer].  

## 2012-07-29 NOTE — Progress Notes (Signed)
[facility] [address], [place] [address] [phone]  Date:  07/29/2012   Name:  Karen Foley   DOB:  August 13, 1965   MRN:  [id number]  PCP:  No primary provider on file.    Chief Complaint: Diarrhea and Constipation   History of Present Illness:  Karen Foley is a 47 y.o. very pleasant female patient who presents with the following:  Since December has experienced on and off alternating constipation and diarrhea.  No blood mucous or pus in stools.  Some nausea associated.  No fever or chills.  Denies GI history or history of hypothyroidism.  No exposure to questionable water or foreign travel.  No relief with otc remedies. Never had a colonoscopy.  No history of ulcerative colitis or inflammatory bowel disease.  Patient Active Problem List  Diagnosis  . OSA on CPAP  . Hypertension  . Hyperlipidemia  . Depression    Past Medical History  Diagnosis Date  . Diabetes mellitus   . Hyperlipidemia   . Hypertension   . Depression   . Seizures   . Thyroid disease   . Asthma     Past Surgical History  Procedure Laterality Date  . Ovarian cyst removal    . Mastoidectomy    . Laproscopy      History  Substance Use Topics  . Smoking status: Never Smoker   . Smokeless tobacco: Never Used  . Alcohol Use: Not on file    Family History  Problem Relation Age of Onset  . Cancer Mother   . Hypertension Mother   . Cancer Father   . Diabetes Father   . Hypertension Father   . Goiter Maternal Grandmother   . Diabetes Maternal Grandfather   . Diabetes Paternal Grandmother     No Known Allergies  Medication list has been reviewed and updated.  Current Outpatient Prescriptions on File Prior to Visit  Medication Sig Dispense Refill  . citalopram (CELEXA) 20 MG tablet Take 1-1/2 tablets daily  60 tablet  11  . losartan (COZAAR) 50 MG tablet Take 1 tablet (50 mg total) by mouth daily.  30 tablet  11  . metFORMIN (GLUCOPHAGE) 500 MG tablet  Take 1 tablet (500 mg total) by mouth 2 (two) times daily with a meal.  180 tablet  3  . rosuvastatin (CRESTOR) 10 MG tablet Take 1 tablet (10 mg total) by mouth daily.  30 tablet  5  . nabumetone (RELAFEN) 750 MG tablet TAKE 1 TABLET (750 MG TOTAL) BY MOUTH 2 (TWO) TIMES DAILY.  40 tablet  0  . pravastatin (PRAVACHOL) 40 MG tablet Take 1 tablet (40 mg total) by mouth daily. NEEDS OFFICE VISIT/LABS  30 tablet  11   No current facility-administered medications on file prior to visit.    Review of Systems:  As per HPI, otherwise negative.    Physical Examination: Filed Vitals:   07/29/12 1949  BP: 170/104  Pulse: 77  Temp: 98.8 F (37.1 C)  Resp: 16   Filed Vitals:   07/29/12 1949  Height: 5\' 5"  (1.651 m)  Weight: 189 lb (85.73 kg)   Body mass index is 31.45 kg/(m^2). Ideal Body Weight: Weight in (lb) to have BMI = 25: 149.9  GEN: WDWN, NAD, Non-toxic, A & O x 3 HEENT: Atraumatic, Normocephalic. Neck supple. No masses, No LAD. Ears and Nose: No external deformity. CV: RRR, No M/G/R. No JVD. No thrill. No extra heart sounds. PULM: CTA B,  no wheezes, crackles, rhonchi. No retractions. No resp. distress. No accessory muscle use. ABD: S, NT, ND, +BS. No rebound. No HSM. EXTR: No c/c/e NEURO Normal gait.  PSYCH: Normally interactive. Conversant. Not depressed or anxious appearing.  Calm demeanor.    Assessment and Plan: Alternating diarrhea and constipation Labs miralax Refer to GI  Carmelina Dane, MD

## 2012-07-30 LAB — COMPREHENSIVE METABOLIC PANEL
AST: 16 U/L (ref 0–37)
Albumin: 4.3 g/dL (ref 3.5–5.2)
Alkaline Phosphatase: 89 U/L (ref 39–117)
Potassium: 3.9 mEq/L (ref 3.5–5.3)
Sodium: 142 mEq/L (ref 135–145)
Total Protein: 7 g/dL (ref 6.0–8.3)

## 2012-07-30 NOTE — Progress Notes (Signed)
Reviewed and agree.

## 2012-08-01 ENCOUNTER — Other Ambulatory Visit: Payer: Self-pay | Admitting: Family Medicine

## 2012-08-20 ENCOUNTER — Other Ambulatory Visit: Payer: Self-pay | Admitting: Gastroenterology

## 2012-08-20 DIAGNOSIS — R110 Nausea: Secondary | ICD-10-CM

## 2012-08-20 DIAGNOSIS — R1032 Left lower quadrant pain: Secondary | ICD-10-CM

## 2012-08-21 ENCOUNTER — Encounter: Payer: Self-pay | Admitting: Emergency Medicine

## 2012-08-28 ENCOUNTER — Ambulatory Visit
Admission: RE | Admit: 2012-08-28 | Discharge: 2012-08-28 | Disposition: A | Discharge status: Home / Self Care | Payer: 59 | Source: Ambulatory Visit | Attending: Gastroenterology | Admitting: Gastroenterology

## 2012-08-28 DIAGNOSIS — R1032 Left lower quadrant pain: Secondary | ICD-10-CM

## 2012-08-28 DIAGNOSIS — R110 Nausea: Secondary | ICD-10-CM

## 2012-08-28 MED ORDER — IOHEXOL 300 MG/ML  SOLN
100.0000 mL | Freq: Once | INTRAMUSCULAR | Status: AC | PRN
  Administered 2012-08-28: 100 mL via INTRAVENOUS

## 2012-08-29 ENCOUNTER — Other Ambulatory Visit: Payer: Self-pay | Admitting: Emergency Medicine

## 2012-09-02 ENCOUNTER — Telehealth: Payer: Self-pay | Admitting: Radiology

## 2012-09-02 NOTE — Telephone Encounter (Signed)
Dr Loreta Ave ordered CT abdomen, Dr Cleta Alberts received report.

## 2012-09-10 ENCOUNTER — Encounter: Payer: Self-pay | Admitting: Neurology

## 2012-09-10 NOTE — Progress Notes (Signed)
Quick Note:  I reviewed the patient's CPAP compliance data from 04/14/2012 to 09/01/2012 which is a total of 141 days, during which time the patient used CPAP every day except for 18 days. The average usage was 5 hours and 50 minutes. The percent used days greater than 4 hours was 80 %, indicating good compliance. The residual AHI was 1.4 per hour, indicating appropriate treatment pressure of 6 cm.   Huston Foley, MD, PhD [facility] ([facility]   ______

## 2012-09-15 ENCOUNTER — Ambulatory Visit (INDEPENDENT_AMBULATORY_CARE_PROVIDER_SITE_OTHER): Payer: 59 | Admitting: Nurse Practitioner

## 2012-09-15 ENCOUNTER — Encounter: Payer: Self-pay | Admitting: Nurse Practitioner

## 2012-09-15 VITALS — BP 147/97 | HR 80 | Ht 66.0 in | Wt 188.0 lb

## 2012-09-15 DIAGNOSIS — G4733 Obstructive sleep apnea (adult) (pediatric): Secondary | ICD-10-CM

## 2012-09-15 DIAGNOSIS — G4710 Hypersomnia, unspecified: Secondary | ICD-10-CM

## 2012-09-15 DIAGNOSIS — Z9989 Dependence on other enabling machines and devices: Secondary | ICD-10-CM

## 2012-09-15 MED ORDER — ARMODAFINIL 250 MG PO TABS: 250.0000 mg | ORAL_TABLET | Freq: Every day | ORAL | Status: DC

## 2012-09-15 NOTE — Progress Notes (Signed)
HPI: The patient returns for followup after last visit with Dr. Vickey Huger 05/28/2012. She has a history of obstructive sleep apnea and is compliant with CPAP at 6 cm of water. Her average daily usage is 5 hours 50 minutes. ESS remains high at 17. She is only using Nuvigil intermittently.   ROS:  -sleepiness, joint pain, not enough sleep, cramps, diarrhea, constipation, rash  Physical Exam General: well developed, well nourished, seated, in no evident distress Head: head normocephalic and atraumatic. Oropharynx benign Neck: supple with no carotid or supraclavicular bruits Cardiovascular: regular rate and rhythm, no murmurs  Neurologic Exam Mental Status: Awake and fully alert. Oriented to place and time. ESS 17.  Cranial Nerves: Fundoscopic exam reveals sharp disc margins. Pupils equal, briskly reactive to light. Extraocular movements full without nystagmus. Visual fields full to confrontation. Hearing intact and symmetric to finger snap. Facial sensation intact. Face, tongue, palate move normally and symmetrically. Neck flexion and extension normal.  Motor: Normal bulk and tone. Normal strength in all tested extremity muscles. Sensory.: intact to touch and pinprick and vibratory.  Coordination: Rapid alternating movements normal in all extremities. Finger-to-nose and heel-to-shin performed accurately bilaterally. Gait and Station: Arises from chair without difficulty. Stance is normal. Gait demonstrates normal stride length and balance . Able to heel, toe and tandem walk without difficulty.  Reflexes: 2+ and symmetric. Toes downgoing.     ASSESSMENT: Obstructive sleep apnea treated with CPAP nasal pillow, currently compliant. Remains sleepy with ESS score 17 today. Is not consistent with Nuvigil.     PLAN:  Obstructive sleep apnea treated with 6 centimeters of water Consistent use of Nuvigil  250 in the morning,   Recommend walking for exercise Followup in 3 months   Nilda Riggs, GNP-BC APRN

## 2012-09-15 NOTE — Patient Instructions (Addendum)
Obstructive sleep apnea treated with 6 mg centimeters of water Consisting use of Nuvigil to 250 in the morning limit caffeine Recommend walking for exercise Followup in 3 months after consisting he said

## 2012-09-21 ENCOUNTER — Other Ambulatory Visit: Payer: Self-pay | Admitting: Neurology

## 2012-09-21 DIAGNOSIS — G4733 Obstructive sleep apnea (adult) (pediatric): Secondary | ICD-10-CM

## 2012-10-07 ENCOUNTER — Ambulatory Visit: Payer: 59

## 2012-10-07 ENCOUNTER — Ambulatory Visit (INDEPENDENT_AMBULATORY_CARE_PROVIDER_SITE_OTHER): Payer: 59 | Admitting: Family Medicine

## 2012-10-07 VITALS — BP 128/90 | HR 60 | Temp 98.3°F | Resp 18 | Ht 66.0 in | Wt 188.0 lb

## 2012-10-07 DIAGNOSIS — R1032 Left lower quadrant pain: Secondary | ICD-10-CM

## 2012-10-07 DIAGNOSIS — R8271 Bacteriuria: Secondary | ICD-10-CM

## 2012-10-07 DIAGNOSIS — E119 Type 2 diabetes mellitus without complications: Secondary | ICD-10-CM | POA: Insufficient documentation

## 2012-10-07 DIAGNOSIS — R82998 Other abnormal findings in urine: Secondary | ICD-10-CM

## 2012-10-07 LAB — COMPREHENSIVE METABOLIC PANEL
ALT: 24 U/L (ref 0–35)
Albumin: 4.4 g/dL (ref 3.5–5.2)
CO2: 28 mEq/L (ref 19–32)
Calcium: 9.4 mg/dL (ref 8.4–10.5)
Chloride: 106 mEq/L (ref 96–112)
Creat: 0.87 mg/dL (ref 0.50–1.10)
Potassium: 3.9 mEq/L (ref 3.5–5.3)
Total Protein: 7.1 g/dL (ref 6.0–8.3)

## 2012-10-07 LAB — POCT URINALYSIS DIPSTICK
Bilirubin, UA: NEGATIVE
Glucose, UA: NEGATIVE
Leukocytes, UA: NEGATIVE
Nitrite, UA: NEGATIVE
Urobilinogen, UA: 0.2

## 2012-10-07 LAB — POCT CBC
HCT, POC: 46.6 % (ref 37.7–47.9)
Hemoglobin: 14.4 g/dL (ref 12.2–16.2)
Lymph, poc: 2.8 (ref 0.6–3.4)
MCHC: 30.9 g/dL — AB (ref 31.8–35.4)
POC Granulocyte: 7 — AB (ref 2–6.9)
WBC: 10.4 10*3/uL — AB (ref 4.6–10.2)

## 2012-10-07 LAB — POCT UA - MICROSCOPIC ONLY: Yeast, UA: NEGATIVE

## 2012-10-07 LAB — GLUCOSE, POCT (MANUAL RESULT ENTRY): POC Glucose: 148 mg/dl — AB (ref 70–99)

## 2012-10-07 MED ORDER — NITROFURANTOIN MONOHYD MACRO 100 MG PO CAPS: 100.0000 mg | ORAL_CAPSULE | Freq: Two times a day (BID) | ORAL | Status: DC

## 2012-10-07 NOTE — Progress Notes (Addendum)
[facility] [address] [place] [address] [phone]  Date:  10/07/2012   Name:  Karen Foley   DOB:  February 20, 1966   MRN:  [id number]  PCP:  Lucilla Edin, MD    Chief Complaint: Abdominal Pain, CT scan and colonoscopy and Fatigue   History of Present Illness:  Karen Foley is a 47 y.o. very pleasant female patient who presents with the following:  She was here about 2 months ago for constipation and diarrhea, as well as LLQ pain. (She reports that she was having this type of LLQ pain at her visit 2 months ago.) She was referred to GI and saw Dr. Loreta Ave- had a colonoscopy and removal of 2 benign polyps.  She then had continued pain and had a CT scan of her abdomen and pelvis on 08/28/12 which was negative  Mid April she felt better, and the pain just occurred off an on.  However, over the week the pain has returned. It has been constant for the last week.    She does not have any constipation- she is having normal to loose stools.   She does note nausea, but no vomiting. She has felt tired and "a little dizzy."   She is eating normally She has not noted a fever.   No urinary symptoms She is s/p hysterectomy- she had a total hysterectomy.  Of note she has a history of several ex- laps due to endometriosis, and states that she had some areas of endometriosis remaining after her hysterectomy "on my intestines."    No vaginal symptoms  Patient Active Problem List   Diagnosis Date Noted  . 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 OSA on CPAP 02/04/2012  . Hypertension 02/04/2012  . Hyperlipidemia 02/04/2012  . Depression 02/04/2012    Past Medical History  Diagnosis Date  . Diabetes mellitus   . Hyperlipidemia   . Hypertension   . Depression   . Seizures   . Thyroid disease   . Asthma     Past Surgical History  Procedure Laterality Date  . Ovarian cyst removal    . Mastoidectomy    . Laproscopy       History  Substance Use Topics  . Smoking status: Never Smoker   . Smokeless tobacco: Never Used  . Alcohol Use: No    Family History  Problem Relation Age of Onset  . Hypertension Mother   . Cancer Mother     breast  . Diabetes Mother   . Cancer Father     prostate  . Diabetes Father   . Hypertension Father   . Goiter Maternal Grandmother   . Diabetes Maternal Grandmother   . Diabetes Maternal Grandfather   . Diabetes Paternal Grandmother     No Known Allergies  Medication list has been reviewed and updated.  Current Outpatient Prescriptions on File Prior to Visit  Medication Sig Dispense Refill  . Biotin 5000 MCG CAPS Take by mouth.      . citalopram (CELEXA) 20 MG tablet TAKE 1 AND 1/2 TABLETS DAILY  60 tablet  3  . Cranberry-Vitamin C-Vitamin E (CRANBERRY PLUS VITAMIN C) 4200-20-3 MG-MG-UNIT CAPS Take by mouth.      . CRESTOR 10 MG tablet TAKE 1 TABLET (10 MG TOTAL) BY MOUTH DAILY.  30 tablet  5  . desonide (DESOWEN) 0.05 % cream Apply topically daily. Apply to face and breast      .  fish oil-omega-3 fatty acids 1000 MG capsule Take 2 g by mouth daily.      Marland Kitchen losartan (COZAAR) 50 MG tablet Take 1 tablet (50 mg total) by mouth daily.  30 tablet  11  . metFORMIN (GLUCOPHAGE) 500 MG tablet Take 1 tablet (500 mg total) by mouth 2 (two) times daily with a meal.  180 tablet  3  . MINOXIDIL, TOPICAL, 5 % SOLN Apply topically at bedtime.      . nabumetone (RELAFEN) 750 MG tablet TAKE 1 TABLET (750 MG TOTAL) BY MOUTH 2 (TWO) TIMES DAILY.  40 tablet  0  . polyethylene glycol powder (GLYCOLAX/MIRALAX) powder Take 17 g by mouth daily.  3350 g  1  . Armodafinil (NUVIGIL) 250 MG tablet Take 1 tablet (250 mg total) by mouth daily.  30 tablet  3   No current facility-administered medications on file prior to visit.    Review of Systems:  As per HPI- otherwise negative.   Physical Examination: Filed Vitals:   10/07/12 1309  BP: 144/102  Pulse: 90  Temp: 98.3 F (36.8  C)  Resp: 18   Filed Vitals:   10/07/12 1309  Height: 5\' 6"  (1.676 m)  Weight: 188 lb (85.276 kg)   Body mass index is 30.36 kg/(m^2). Ideal Body Weight: Weight in (lb) to have BMI = 25: 154.6  GEN: WDWN, NAD, Non-toxic, A & O x 3, overweight HEENT: Atraumatic, Normocephalic. Neck supple. No masses, No LAD. Ears and Nose: No external deformity. CV: RRR, No M/G/R. No JVD. No thrill. No extra heart sounds. PULM: CTA B, no wheezes, crackles, rhonchi. No retractions. No resp. distress. No accessory muscle use. ABD: S, NT, ND, +BS. No rebound. No HSM. She does not have any pain with palpation of her abdomen.  She does feel that the left LQ feels "harder" or "more swollen"  than the right, and that pressing on the area feels slightly "sore".    Performed exam at the beginning and end of the visit.  No change EXTR: No c/c/e NEURO Normal gait.  PSYCH: Normally interactive. Conversant. Not depressed or anxious appearing.  Calm demeanor.   [facility] reading (PRIMARY) by  Dr. Patsy Lager. Abdominal series: negative  ABDOMEN - 2 VIEW  Comparison: CT 08/28/2012  Findings: Chest: Heart size is upper normal. Vascularity is normal. Lungs are free of infiltrate or effusion. Negative for mass lesion.  Abdomen: Normal bowel gas pattern. No air-fluid level or free air. No renal calculi. Mild dextroscoliosis without acute bony abnormality.  IMPRESSION: No acute abnormality chest or abdomen.    Results for orders placed in visit on 10/07/12  POCT CBC      Result Value Range   WBC 10.4 (*) 4.6 - 10.2 K/uL   Lymph, poc 2.8  0.6 - 3.4   POC LYMPH PERCENT 26.7  10 - 50 %L   MID (cbc) 0.6  0 - 0.9   POC MID % 5.8  0 - 12 %M   POC Granulocyte 7.0 (*) 2 - 6.9   Granulocyte percent 67.5  37 - 80 %G   RBC 4.85  4.04 - 5.48 M/uL   Hemoglobin 14.4  12.2 - 16.2 g/dL   HCT, POC 16.1  09.6 - 47.9 %   MCV 96.1  80 - 97 fL   MCH, POC 29.7  27 - 31.2 pg   MCHC 30.9 (*) 31.8 - 35.4 g/dL   RDW, POC 04.5      Platelet Count, POC 244  142 - 424 K/uL   MPV 10.6  0 - 99.8 fL  GLUCOSE, POCT (MANUAL RESULT ENTRY)      Result Value Range   POC Glucose 148 (*) 70 - 99 mg/dl  POCT UA - MICROSCOPIC ONLY      Result Value Range   WBC, Ur, HPF, POC 0-2     RBC, urine, microscopic neg     Bacteria, U Microscopic 2+     Mucus, UA large     Epithelial cells, urine per micros 0-4     Crystals, Ur, HPF, POC acid crystals     Casts, Ur, LPF, POC neg     Yeast, UA neg    POCT URINALYSIS DIPSTICK      Result Value Range   Color, UA amber     Clarity, UA clear     Glucose, UA neg     Bilirubin, UA neg     Ketones, UA trace     Spec Grav, UA >=1.030     Blood, UA small     pH, UA 6.0     Protein, UA neg     Urobilinogen, UA 0.2     Nitrite, UA neg     Leukocytes, UA Negative     Called and discussed with Dr. Loreta Ave- her recent colonoscopy did not reveal diverticulosis, so diverticulitis should not be a possibility at this time.  Suggested an ultrasound to look for any sign of residual endometriosis tissue.    Assessment and Plan: Abdominal pain, left lower quadrant - Plan: POCT CBC, POCT glucose (manual entry), Comprehensive metabolic panel, POCT UA - Microscopic Only, POCT urinalysis dipstick, DG Abd 2 Views, Urine culture, US Pelvis Complete  Bacteria in urine - Plan: nitrofurantoin, macrocrystal-monohydrate, (MACROBID) 100 MG capsule  Coralyn is here today with abdominal complaints for the last 2 months, currently with symptoms for the last week or so.  Explained that her WBC count is minimally elevated.  Diverticulitis should not be a concern, and her presentation is not typical of appendicitis.  Offered to do a CT scan, but did discuss the fact that she had one just a month ago and we need to be cautious in our use of radiation. She declines a CT at this time but would like to proceed with ultrasound tomorrow.  She will be sure to come back if not better in the next day or two.  Also, she may have a UTI.   Will start macrobid while we await her urine culture, CMP and ultrasound results.     Signed Abbe Amsterdam, MD  5/22- called to go over her ultrasound and lab results.  No answer so left detailed message-The ultrasound is normal and did not see any residual endometriosis tissue. Her other labs are also ok.  If she continues to have a lot of pain we may have to repeat a CT scan.  Asked her to please call or RTC today if she is not better or if worse for a recheck.    Called again at 5:30 pm, no answer

## 2012-10-07 NOTE — Patient Instructions (Addendum)
Let us know if you are not better in the next 2 days- sooner if you start to get worse.  We are going to try treating you for a UTI.  I will let you know the results of your urine culture and other labs.  We will work on getting your ultrasound set up for this week and we will give you a call.

## 2012-10-08 ENCOUNTER — Ambulatory Visit
Admission: RE | Admit: 2012-10-08 | Discharge: 2012-10-08 | Disposition: A | Discharge status: Home / Self Care | Payer: 59 | Source: Ambulatory Visit | Attending: Family Medicine | Admitting: Family Medicine

## 2012-10-08 ENCOUNTER — Telehealth: Payer: Self-pay | Admitting: Family Medicine

## 2012-10-08 DIAGNOSIS — R1032 Left lower quadrant pain: Secondary | ICD-10-CM

## 2012-10-08 NOTE — Telephone Encounter (Signed)
Called and was able to speak with her- went over her ultrasound results.  She states she feels better today.  Went over possible causes of her pain- adhesions, endometriosis.  She believes her appendix has been previously resected during another operation but we do not have proof of this.  Recent colonoscopy did not show diverticulosis.    Again offered to perform a CT scan for her, but she would like to give herself a bit more time, she will come in for a recheck tomorrow if not continuing to feel better so we can recheck her CBC

## 2012-10-09 ENCOUNTER — Ambulatory Visit (INDEPENDENT_AMBULATORY_CARE_PROVIDER_SITE_OTHER): Payer: 59 | Admitting: Family Medicine

## 2012-10-09 VITALS — BP 125/90 | HR 86 | Temp 98.5°F | Resp 16 | Ht 64.0 in | Wt 188.0 lb

## 2012-10-09 DIAGNOSIS — R1032 Left lower quadrant pain: Secondary | ICD-10-CM

## 2012-10-09 LAB — POCT CBC
Granulocyte percent: 71.9 %G (ref 37–80)
Hemoglobin: 14.1 g/dL (ref 12.2–16.2)
MCH, POC: 30 pg (ref 27–31.2)
MCV: 95.9 fL (ref 80–97)
RBC: 4.7 M/uL (ref 4.04–5.48)
WBC: 10 10*3/uL (ref 4.6–10.2)

## 2012-10-09 LAB — URINE CULTURE
Colony Count: NO GROWTH
Organism ID, Bacteria: NO GROWTH

## 2012-10-09 MED ORDER — DICYCLOMINE HCL 20 MG PO TABS: 20.0000 mg | ORAL_TABLET | Freq: Four times a day (QID) | ORAL | Status: DC

## 2012-10-09 MED ORDER — DICYCLOMINE HCL 10 MG PO CAPS: 10.0000 mg | ORAL_CAPSULE | Freq: Three times a day (TID) | ORAL | Status: DC

## 2012-10-09 NOTE — Progress Notes (Signed)
[facility] [address] [place] [address] [phone]  Date:  10/09/2012   Name:  Karen Foley   DOB:  05-03-1966   MRN:  [id number]  PCP:  Karen Edin, MD    Chief Complaint: Follow-up   History of Present Illness:  Karen Foley is a 47 y.o. very pleasant female patient who presents with the following:  She is here today for a recheck of abdominal pain- it is staying in the LLQ.  Occasionally she will feel some pain in the RLQ, but it does not persist.   No BM yet today, but she did go yesterday. No fever, but she did note some chills and hot flashes today.   She has noted nausea, but no vomiting.  She is using emetrol OTC for nausea.   She has not eaten yet today.  Menarche at the age of 11- right away she started to have debilitating dysmenorrhea.  She was diagnosed with endometriosis.  She had laparascopy- she has had 3 total she believes.   She had a total hysterectomy in 03/2002.  She was told that there were 3 spots of endometriosis on her intestines that could not be removed.  She has had hot flashes over the last 10 years.    She first started to notice the pain in her LLQ in December.  She came to see [place] in March, was referred to GI and had a negative colonoscopy in April and a negative CT abdomen and pelvis in April.     She has suffered from intermittent diarrhea and constipation in the past, although this is not as prominent now.    She uses miralax prn Patient Active Problem List   Diagnosis Date Noted  . Type II or unspecified type diabetes mellitus without mention of complication, not stated as uncontrolled 10/07/2012  . 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 OSA on CPAP 02/04/2012  . Hypertension 02/04/2012  . Hyperlipidemia 02/04/2012  . Depression 02/04/2012    Past Medical History  Diagnosis Date  . Diabetes mellitus   . Hyperlipidemia   . Hypertension   . Depression   .  Seizures   . Thyroid disease   . Asthma     Past Surgical History  Procedure Laterality Date  . Ovarian cyst removal    . Mastoidectomy    . Laproscopy      History  Substance Use Topics  . Smoking status: Never Smoker   . Smokeless tobacco: Never Used  . Alcohol Use: No    Family History  Problem Relation Age of Onset  . Hypertension Mother   . Cancer Mother     breast  . Diabetes Mother   . Cancer Father     prostate  . Diabetes Father   . Hypertension Father   . Goiter Maternal Grandmother   . Diabetes Maternal Grandmother   . Diabetes Maternal Grandfather   . Diabetes Paternal Grandmother     No Known Allergies  Medication list has been reviewed and updated.  Current Outpatient Prescriptions on File Prior to Visit  Medication Sig Dispense Refill  . Armodafinil (NUVIGIL) 250 MG tablet Take 1 tablet (250 mg total) by mouth daily.  30 tablet  3  . Biotin 5000 MCG CAPS Take by mouth.      . citalopram (CELEXA) 20 MG tablet TAKE 1 AND 1/2 TABLETS DAILY  60 tablet  3  . Cranberry-Vitamin C-Vitamin E (  CRANBERRY PLUS VITAMIN C) 4200-20-3 MG-MG-UNIT CAPS Take by mouth.      . CRESTOR 10 MG tablet TAKE 1 TABLET (10 MG TOTAL) BY MOUTH DAILY.  30 tablet  5  . desonide (DESOWEN) 0.05 % cream Apply topically daily. Apply to face and breast      . fish oil-omega-3 fatty acids 1000 MG capsule Take 2 g by mouth daily.      Marland Kitchen losartan (COZAAR) 50 MG tablet Take 1 tablet (50 mg total) by mouth daily.  30 tablet  11  . metFORMIN (GLUCOPHAGE) 500 MG tablet Take 1 tablet (500 mg total) by mouth 2 (two) times daily with a meal.  180 tablet  3  . MINOXIDIL, TOPICAL, 5 % SOLN Apply topically at bedtime.      . nabumetone (RELAFEN) 750 MG tablet TAKE 1 TABLET (750 MG TOTAL) BY MOUTH 2 (TWO) TIMES DAILY.  40 tablet  0  . nitrofurantoin, macrocrystal-monohydrate, (MACROBID) 100 MG capsule Take 1 capsule (100 mg total) by mouth 2 (two) times daily.  14 capsule  0  . polyethylene glycol  powder (GLYCOLAX/MIRALAX) powder Take 17 g by mouth daily.  3350 g  1   No current facility-administered medications on file prior to visit.    Review of Systems:  As per HPI- otherwise negative.   Physical Examination: Filed Vitals:   10/09/12 1250  BP: 150/106  Pulse: 86  Temp: 98.5 F (36.9 C)  Resp: 16   Filed Vitals:   10/09/12 1250  Height: 5\' 4"  (1.626 m)  Weight: 188 lb (85.276 kg)   Body mass index is 32.25 kg/(m^2). Ideal Body Weight: Weight in (lb) to have BMI = 25: 145.3  GEN: WDWN, NAD, Non-toxic, A & O x 3, looks well HEENT: Atraumatic, Normocephalic. Neck supple. No masses, No LAD. Ears and Nose: No external deformity. CV: RRR, No M/G/R. No JVD. No thrill. No extra heart sounds. PULM: CTA B, no wheezes, crackles, rhonchi. No retractions. No resp. distress. No accessory muscle use. ABD: S, NT, +BS. No rebound. No HSM.  Again she has minimal tenderness in her LLQ.   EXTR: No c/c/e NEURO Normal gait.  PSYCH: Normally interactive. Conversant. Not depressed or anxious appearing.  Calm demeanor.   Results for orders placed in visit on 10/09/12  POCT CBC      Result Value Range   WBC 10.0  4.6 - 10.2 K/uL   Lymph, poc 2.2  0.6 - 3.4   POC LYMPH PERCENT 22.4  10 - 50 %L   MID (cbc) 0.6  0 - 0.9   POC MID % 5.7  0 - 12 %M   POC Granulocyte 7.2 (*) 2 - 6.9   Granulocyte percent 71.9  37 - 80 %G   RBC 4.70  4.04 - 5.48 M/uL   Hemoglobin 14.1  12.2 - 16.2 g/dL   HCT, POC 16.1  09.6 - 47.9 %   MCV 95.9  80 - 97 fL   MCH, POC 30.0  27 - 31.2 pg   MCHC 31.3 (*) 31.8 - 35.4 g/dL   RDW, POC 04.5     Platelet Count, POC 236  142 - 424 K/uL   MPV 10.1  0 - 99.8 fL    Assessment and Plan: LLQ pain - Plan: POCT CBC, dicyclomine (BENTYL) 20 MG tablet, DISCONTINUED: dicyclomine (BENTYL) 10 MG capsule  Karen Foley is here with LLQ pain of about 5 months duration, s/p normal colonoscopy and normal CT abd/ pelvis and  normal pelvic ultrasound.  Confirmed with radiologist  that there was no significant diverticulosis on her recent CT.  ddx includes IBS, adhesions, endometriosis.  We will try bentyl for possible IBS.  She will follow- up with Dr. Kenna Foley office next week.  If any worsening in the meantime she is to seek care/ call me  Signed Abbe Amsterdam, MD

## 2012-10-09 NOTE — Patient Instructions (Addendum)
Try using the bentyl before meals and before bed as needed for abdominal pain.  Call Dr. Kenna Gilbert office today and make an appt for next week. Please keep me up to date on your progress- if you are not feeling better in the next couple of days please give me a call, Sooner if worse.

## 2012-10-11 ENCOUNTER — Telehealth: Payer: Self-pay

## 2012-10-11 NOTE — Telephone Encounter (Signed)
Dr. Patsy Lager   Patient states her pain has shifted to the right side today   [phone]

## 2012-10-12 ENCOUNTER — Ambulatory Visit (INDEPENDENT_AMBULATORY_CARE_PROVIDER_SITE_OTHER): Payer: 59 | Admitting: Family Medicine

## 2012-10-12 ENCOUNTER — Ambulatory Visit (HOSPITAL_COMMUNITY)
Admission: RE | Admit: 2012-10-12 | Discharge: 2012-10-12 | Disposition: A | Discharge status: Home / Self Care | Payer: 59 | Source: Ambulatory Visit | Attending: Family Medicine | Admitting: Family Medicine

## 2012-10-12 ENCOUNTER — Encounter (HOSPITAL_COMMUNITY): Payer: Self-pay

## 2012-10-12 DIAGNOSIS — R1031 Right lower quadrant pain: Secondary | ICD-10-CM | POA: Insufficient documentation

## 2012-10-12 DIAGNOSIS — G8929 Other chronic pain: Secondary | ICD-10-CM | POA: Insufficient documentation

## 2012-10-12 LAB — POCT WET PREP WITH KOH
Clue Cells Wet Prep HPF POC: NEGATIVE
KOH Prep POC: NEGATIVE
RBC Wet Prep HPF POC: NEGATIVE

## 2012-10-12 LAB — POCT CBC
Granulocyte percent: 68.3 %G (ref 37–80)
HCT, POC: 42.9 % (ref 37.7–47.9)
Lymph, poc: 2.5 (ref 0.6–3.4)
MCHC: 32.2 g/dL (ref 31.8–35.4)
MCV: 94.8 fL (ref 80–97)
MID (cbc): 0.7 (ref 0–0.9)
POC Granulocyte: 7 — AB (ref 2–6.9)
POC LYMPH PERCENT: 24.7 %L (ref 10–50)
Platelet Count, POC: 221 10*3/uL (ref 142–424)
RDW, POC: 13.9 %

## 2012-10-12 MED ORDER — IOHEXOL 300 MG/ML  SOLN
100.0000 mL | Freq: Once | INTRAMUSCULAR | Status: AC | PRN
  Administered 2012-10-12: 100 mL via INTRAVENOUS

## 2012-10-12 NOTE — Progress Notes (Signed)
[facility] [address] [place] [address] [phone]  Date:  10/12/2012   Name:  Karen Foley   DOB:  1966-03-20   MRN:  [id number]  PCP:  Lucilla Edin, MD    Chief Complaint: Follow-up   History of Present Illness:  Karen Foley is a 47 y.o. very pleasant female patient who presents with the following:  Here to recheck abdominal pain.  She started to notice RLQ tenderness yesterday, it has been present off and on since yesterday.  She has felt nauseated, no vomiting.  No diarrhea. She has had BMs- she did go today.   She has been able to eat ok.   This pain is different from her usual abdominal pain due to its location  Patient Active Problem List   Diagnosis Date Noted  . Type II or unspecified type diabetes mellitus without mention of complication, not stated as uncontrolled 10/07/2012  . 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 OSA on CPAP 02/04/2012  . Hypertension 02/04/2012  . Hyperlipidemia 02/04/2012  . Depression 02/04/2012    Past Medical History  Diagnosis Date  . Diabetes mellitus   . Hyperlipidemia   . Hypertension   . Depression   . Seizures   . Thyroid disease   . Asthma     Past Surgical History  Procedure Laterality Date  . Ovarian cyst removal    . Mastoidectomy    . Laproscopy      History  Substance Use Topics  . Smoking status: Never Smoker   . Smokeless tobacco: Never Used  . Alcohol Use: No    Family History  Problem Relation Age of Onset  . Hypertension Mother   . Cancer Mother     breast  . Diabetes Mother   . Cancer Father     prostate  . Diabetes Father   . Hypertension Father   . Goiter Maternal Grandmother   . Diabetes Maternal Grandmother   . Diabetes Maternal Grandfather   . Diabetes Paternal Grandmother     No Known Allergies  Medication list has been reviewed and updated.  Current Outpatient Prescriptions on File Prior to Visit   Medication Sig Dispense Refill  . Armodafinil (NUVIGIL) 250 MG tablet Take 1 tablet (250 mg total) by mouth daily.  30 tablet  3  . Biotin 5000 MCG CAPS Take by mouth.      . citalopram (CELEXA) 20 MG tablet TAKE 1 AND 1/2 TABLETS DAILY  60 tablet  3  . Cranberry-Vitamin C-Vitamin E (CRANBERRY PLUS VITAMIN C) 4200-20-3 MG-MG-UNIT CAPS Take by mouth.      . CRESTOR 10 MG tablet TAKE 1 TABLET (10 MG TOTAL) BY MOUTH DAILY.  30 tablet  5  . desonide (DESOWEN) 0.05 % cream Apply topically daily. Apply to face and breast      . dicyclomine (BENTYL) 20 MG tablet Take 1 tablet (20 mg total) by mouth every 6 (six) hours.  40 tablet  1  . fish oil-omega-3 fatty acids 1000 MG capsule Take 2 g by mouth daily.      Marland Kitchen losartan (COZAAR) 50 MG tablet Take 1 tablet (50 mg total) by mouth daily.  30 tablet  11  . metFORMIN (GLUCOPHAGE) 500 MG tablet Take 1 tablet (500 mg total) by mouth 2 (two) times daily with a meal.  180 tablet  3  . MINOXIDIL, TOPICAL, 5 % SOLN Apply topically at bedtime.      Marland Kitchen  nabumetone (RELAFEN) 750 MG tablet TAKE 1 TABLET (750 MG TOTAL) BY MOUTH 2 (TWO) TIMES DAILY.  40 tablet  0  . nitrofurantoin, macrocrystal-monohydrate, (MACROBID) 100 MG capsule Take 1 capsule (100 mg total) by mouth 2 (two) times daily.  14 capsule  0  . polyethylene glycol powder (GLYCOLAX/MIRALAX) powder Take 17 g by mouth daily.  3350 g  1   No current facility-administered medications on file prior to visit.    Review of Systems:  As per HPI- otherwise negative.   Physical Examination: Filed Vitals:   10/12/12 1329  BP: 136/78  Pulse: 70  Temp: 97.8 F (36.6 C)  Resp: 18   Filed Vitals:   10/12/12 1329  Height: 5\' 4"  (1.626 m)  Weight: 189 lb (85.73 kg)   Body mass index is 32.43 kg/(m^2). Ideal Body Weight: Weight in (lb) to have BMI = 25: 145.3  GEN: WDWN, NAD, Non-toxic, A & O x 3, overweight, looks well HEENT: Atraumatic, Normocephalic. Neck supple. No masses, No LAD. Ears and Nose:  No external deformity. CV: RRR, No M/G/R. No JVD. No thrill. No extra heart sounds. PULM: CTA B, no wheezes, crackles, rhonchi. No retractions. No resp. distress. No accessory muscle use. ABD: S, ND, +BS. No rebound. No HSM. Now with tenderness in her RLQ, persistent on repeat exams.   EXTR: No c/c/e NEURO Normal gait.  PSYCH: Normally interactive. Conversant. Not depressed or anxious appearing.  Calm demeanor.  Pelvic: normal GU exam.  Of note pt has never been SA.  No vaginal lesions, no adnexal masses, no CMT   Results for orders placed in visit on 10/12/12  POCT CBC      Result Value Range   WBC 10.2  4.6 - 10.2 K/uL   Lymph, poc 2.5  0.6 - 3.4   POC LYMPH PERCENT 24.7  10 - 50 %L   MID (cbc) 0.7  0 - 0.9   POC MID % 7.0  0 - 12 %M   POC Granulocyte 7.0 (*) 2 - 6.9   Granulocyte percent 68.3  37 - 80 %G   RBC 4.53  4.04 - 5.48 M/uL   Hemoglobin 13.8  12.2 - 16.2 g/dL   HCT, POC 16.1  09.6 - 47.9 %   MCV 94.8  80 - 97 fL   MCH, POC 30.5  27 - 31.2 pg   MCHC 32.2  31.8 - 35.4 g/dL   RDW, POC 04.5     Platelet Count, POC 221  142 - 424 K/uL   MPV 10.0  0 - 99.8 fL  POCT WET PREP WITH KOH      Result Value Range   Trichomonas, UA Negative     Clue Cells Wet Prep HPF POC neg     Epithelial Wet Prep HPF POC 0-1     Yeast Wet Prep HPF POC neg     Bacteria Wet Prep HPF POC trace     RBC Wet Prep HPF POC neg     WBC Wet Prep HPF POC neg     KOH Prep POC Negative       Assessment and Plan: Abdominal pain, chronic, right lower quadrant, right - Plan: POCT CBC, POCT Wet Prep with KOH, CT Abdomen Pelvis W Contrast  Karen Foley is here again for abdominal pain.  She has had abdominal pain off an on since she was a teen, and recently underwent a CT scan and GI evaluation including colonoscopy. However, this is her 3rd visit  with me for this issue, and today she states her pain has changed to the right lower quadrant, which is not typical for her.  It is possible that she has appendicitis.  We do not have proof that her appendix has been removed previously, although it has not been seen on previous scans.  Discussed R/B of a repeat CT today and she would like to proceed with a CT scan.    Signed Abbe Amsterdam, MD  CT ABDOMEN AND PELVIS WITH CONTRAST  Technique: Multidetector CT imaging of the abdomen and pelvis was performed following the standard protocol during bolus administration of intravenous contrast.  Contrast: OMNIPAQUE IOHEXOL 300 MG/ML SOLN  Comparison: CT 08/28/2012  Findings: Lung bases are clear. No pericardial fluid.  No focal hepatic lesion. The gallbladder, pancreas, spleen, adrenal glands, and kidneys are normal.  The stomach, small bowel, and cecum are normal. Appendix not identified. There is no pericecal inflammation to suggest acute ascites. The colon and rectosigmoid colon are normal.  Abdominal aorta is normal caliber. No retroperitoneal periportal lymphadenopathy.  No free fluid in the pelvis. No distal ureteral stones or bladder stones. Post hysterectomy anatomy. No pelvic lymphadenopathy. Review of bone windows demonstrates no aggressive osseous lesions.  IMPRESSION:  1. No acute abdominal or pelvic findings. 2. Normal gallbladder.  3. The appendix is not visualized but there are no secondary signs of acute appendicitis. 4. Post hysterectomy anatomy.  Called pt with CT scan results around 8pm on 5/26- no evidence of appendicitis or other acute cause of pain.  Will try some medication for pain, plan follow-up with GI, ? Surgical consultation for adhesions vs persistent endometriosis.    Called her 5/27- we will try some medication for her pain.  She has used mobic in the past without ill effect.  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  Will give a trial of mobic INSTEAD of the relafen that she uses on occasion.  She will see Dr. Loreta Ave in 2 days.  Asked her to follow-up with me by  phone after this visit

## 2012-10-12 NOTE — Patient Instructions (Signed)
Patient Name: Karen Foley   Please be advised that your notification number for [id number] for Karen Foley is [phone]. This Notification number is valid for 45 calendar days, expiring on 11/26/2012 and is not a guarantee of payment. Payment is dependent upon the enrollee's benefit plan and the health care provider's contract. Please share these numbers with the member and the rendering provider if you are not providing the imaging service.  You can go to [facility] [place] for the scan now, you will drink contrast, then you will have the scan. The radiology department at [facility] will call a report of the scan while you are there.    Driving directions to [facility]D[address]  [phone]  - more info    [address]  [place], [place] [address]     1. Head south on [address] toward [address]      0.5 mi    2. Sharp left onto [address]      0.6 mi    3. Turn left onto the [address] ramp      0.2 mi    4. Merge onto [address]      3.0 mi    5. Continue straight to stay on [address]      0.4 mi    6. Slight left to stay on [address]      1.2 mi    7. Turn right onto [address]      0.1 mi    8. Turn left onto [address]      361 ft    9. Take the 1st left onto [facility]  Destination will be on the right

## 2012-10-12 NOTE — Telephone Encounter (Signed)
She now notes right sided abdominal pain.  This is a change from previous.  She will come in for evalaution

## 2012-10-12 NOTE — Telephone Encounter (Signed)
Called patient. She is having lower right sided abdominal pain, she has appt on Thursday with GI doctor. She states pain has eased now, as compared to yesterday. Please advise.

## 2012-10-13 ENCOUNTER — Telehealth: Payer: Self-pay

## 2012-10-13 MED ORDER — MELOXICAM 7.5 MG PO TABS: 7.5000 mg | ORAL_TABLET | Freq: Every day | ORAL | Status: DC

## 2012-10-13 NOTE — Telephone Encounter (Signed)
I spoke with her already this morning

## 2012-10-13 NOTE — Telephone Encounter (Signed)
She did have the CT scan yesterday.

## 2012-10-13 NOTE — Telephone Encounter (Signed)
PT WAS SEEN YESTERDAY AND TOLD IF NO BETTER, TO CALL BACK AND SHE ISN'T GETTING ANY BETTER. PLEASE CALL [phone] AND ADVISE

## 2012-10-15 ENCOUNTER — Telehealth: Payer: Self-pay

## 2012-10-15 DIAGNOSIS — M25559 Pain in unspecified hip: Secondary | ICD-10-CM

## 2012-10-16 NOTE — Telephone Encounter (Signed)
Dr Loreta Ave d/c the Meloxicam yesterday and gave her 2 probiotics to take. Pt states she did not offer any suggestions to help with the pain. She wants to know if you have anything to offer in addition to what Dr Loreta Ave has already done.

## 2012-10-16 NOTE — Telephone Encounter (Signed)
Patient called with questions about appt with Dr Loreta Ave

## 2012-10-17 NOTE — Telephone Encounter (Signed)
Called her back- she has stopped her metformin on the advice of her endocrinologist (on the thinking that this could be causing more endometriosis sx or GI symptoms).  This does seem to be helping some.  She has been off metformin for about one week now.  She is not checking her glucose but will start.  Asked her to check her glucose at least a few times a week and let me know if it is getting too high.  She will plan to come in in a a couple of weeks to check an A1c and follow-up.  I will refer her to OBG to discuss her endometriosis

## 2012-10-21 ENCOUNTER — Ambulatory Visit (INDEPENDENT_AMBULATORY_CARE_PROVIDER_SITE_OTHER): Payer: 59 | Admitting: Family Medicine

## 2012-10-21 VITALS — BP 132/80 | HR 73 | Temp 97.9°F | Resp 18 | Ht 65.5 in | Wt 188.0 lb

## 2012-10-21 DIAGNOSIS — E119 Type 2 diabetes mellitus without complications: Secondary | ICD-10-CM

## 2012-10-21 DIAGNOSIS — I10 Essential (primary) hypertension: Secondary | ICD-10-CM

## 2012-10-21 LAB — COMPREHENSIVE METABOLIC PANEL WITH GFR
AST: 18 U/L (ref 0–37)
Alkaline Phosphatase: 83 U/L (ref 39–117)
BUN: 13 mg/dL (ref 6–23)
Creat: 0.84 mg/dL (ref 0.50–1.10)
Total Bilirubin: 0.6 mg/dL (ref 0.3–1.2)

## 2012-10-21 LAB — LIPID PANEL
Cholesterol: 182 mg/dL (ref 0–200)
HDL: 51 mg/dL (ref >39)
LDL Cholesterol: 116 mg/dL — ABNORMAL HIGH (ref 0–99)
Total CHOL/HDL Ratio: 3.6 Ratio
Triglycerides: 77 mg/dL (ref <150)
VLDL: 15 mg/dL (ref 0–40)

## 2012-10-21 LAB — COMPREHENSIVE METABOLIC PANEL
ALT: 26 U/L (ref 0–35)
Albumin: 4.7 g/dL (ref 3.5–5.2)
CO2: 27 mEq/L (ref 19–32)
Calcium: 9 mg/dL (ref 8.4–10.5)
Chloride: 104 mEq/L (ref 96–112)
Glucose, Bld: 117 mg/dL — ABNORMAL HIGH (ref 70–99)
Potassium: 4.2 mEq/L (ref 3.5–5.3)
Sodium: 142 mEq/L (ref 135–145)
Total Protein: 7.1 g/dL (ref 6.0–8.3)

## 2012-10-21 LAB — TSH: TSH: 0.958 u[IU]/mL (ref 0.350–4.500)

## 2012-10-21 LAB — POCT GLYCOSYLATED HEMOGLOBIN (HGB A1C): Hemoglobin A1C: 6.6

## 2012-10-21 NOTE — Progress Notes (Signed)
[facility]:  Office Visit  Chief Complaint:  Chief Complaint  Patient presents with  . Diabetes    A1C check    HPI: Karen Foley is a 47 y.o. female who is here for diabetes check She has been off metformin  For the last 9 days ago. Her story is a little complicated. She has endometriosis, she has had a recent flare up and she was told by her endocrinologist that perhaps the metformin is aggravating her endometriosis/GI sxs. So she has not been taking her metformin, and has not checked her sugars as Dr. Patsy Lager had recommended. She was put on mobic for her pain and was told by GI not to take mobic because it can worsen it. She has been taking tylenol since. She is currently on a 2 week metformin free trail.  Optho check tomorrow, Denies neuropathy, She denies polyduipsia/uria. Denies hypoglycemia.   HTN-well controlled, no SEs, compliant. On Losartan  Past Medical History  Diagnosis Date  . Diabetes mellitus   . Hyperlipidemia   . Hypertension   . Depression   . Seizures   . Thyroid disease   . Asthma    Past Surgical History  Procedure Laterality Date  . Ovarian cyst removal    . Mastoidectomy    . Laproscopy     History   Social History  . Marital Status: Single    Spouse Name: N/A    Number of Children: N/A  . Years of Education: N/A   Social History Main Topics  . Smoking status: Never Smoker   . Smokeless tobacco: Never Used  . Alcohol Use: No  . Drug Use: No  . Sexually Active: None   Other Topics Concern  . None   Social History Narrative  . None   Family History  Problem Relation Age of Onset  . Hypertension Mother   . Cancer Mother     breast  . Diabetes Mother   . Cancer Father     prostate  . Diabetes Father   . Hypertension Father   . Goiter Maternal Grandmother   . Diabetes Maternal Grandmother   . Diabetes Maternal Grandfather   . Diabetes Paternal Grandmother    No Known Allergies Prior to Admission  medications   Medication Sig Start Date End Date Taking? Authorizing Provider  Armodafinil (NUVIGIL) 250 MG tablet Take 1 tablet (250 mg total) by mouth daily. 09/15/12  Yes Nilda Riggs, NP  Biotin 5000 MCG CAPS Take by mouth.   Yes Historical Provider, MD  citalopram (CELEXA) 20 MG tablet TAKE 1 AND 1/2 TABLETS DAILY 08/01/12  Yes Marzella Schlein McClung, PA-C  Cranberry-Vitamin C-Vitamin E (CRANBERRY PLUS VITAMIN C) 4200-20-3 MG-MG-UNIT CAPS Take by mouth.   Yes Historical Provider, MD  CRESTOR 10 MG tablet TAKE 1 TABLET (10 MG TOTAL) BY MOUTH DAILY. 08/29/12  Yes Marzella Schlein McClung, PA-C  desonide (DESOWEN) 0.05 % cream Apply topically daily. Apply to face and breast   Yes Historical Provider, MD  dicyclomine (BENTYL) 20 MG tablet Take 1 tablet (20 mg total) by mouth every 6 (six) hours. 10/09/12  Yes Gwenlyn Found Copland, MD  losartan (COZAAR) 50 MG tablet Take 1 tablet (50 mg total) by mouth daily. 02/04/12  Yes Collene Gobble, MD  meloxicam (MOBIC) 7.5 MG tablet Take 1 tablet (7.5 mg total) by mouth daily. Make take up to 2 day if needed 10/13/12  Yes Jessica C Copland, MD  MINOXIDIL, TOPICAL, 5 %  SOLN Apply topically at bedtime.   Yes Historical Provider, MD  polyethylene glycol powder (GLYCOLAX/MIRALAX) powder Take 17 g by mouth daily. 07/29/12  Yes Phillips Odor, MD  fish oil-omega-3 fatty acids 1000 MG capsule Take 2 g by mouth daily.    Historical Provider, MD  metFORMIN (GLUCOPHAGE) 500 MG tablet Take 1 tablet (500 mg total) by mouth 2 (two) times daily with a meal. 03/05/12   Chelle S Jeffery, PA-C     ROS: The patient denies fevers, chills, night sweats, unintentional weight loss, chest pain, palpitations, wheezing, dyspnea on exertion, dysuria, hematuria, melena, numbness, weakness, or tingling.   All other systems have been reviewed and were otherwise negative with the exception of those mentioned in the HPI and as above.    PHYSICAL EXAM: Filed Vitals:   10/21/12 0921  BP: 132/80   Pulse: 73  Temp: 97.9 F (36.6 C)  Resp: 18   Filed Vitals:   10/21/12 0921  Height: 5' 5.5" (1.664 m)  Weight: 188 lb (85.276 kg)   Body mass index is 30.8 kg/(m^2).  General: Alert, no acute distress HEENT:  Normocephalic, atraumatic, oropharynx patent. EOMI, PERRLA. Fundoscopic exam grossly nl Cardiovascular:  Regular rate and rhythm, no rubs murmurs or gallops.  No Carotid bruits, radial pulse intact. No pedal edema.  Respiratory: Clear to auscultation bilaterally.  No wheezes, rales, or rhonchi.  No cyanosis, no use of accessory musculature GI: No organomegaly, abdomen is soft and non-tender, positive bowel sounds.  No masses. Skin: No rashes. Neurologic: Facial musculature symmetric. Normal foot and microfilament exam Psychiatric: Patient is appropriate throughout our interaction. Lymphatic: No cervical lymphadenopathy Musculoskeletal: Gait intact. Normal foot and microfilament exam  LABS: Results for orders placed in visit on 10/21/12  POCT GLYCOSYLATED HEMOGLOBIN (HGB A1C)      Result Value Range   Hemoglobin A1C 6.6       EKG/XRAY:   Primary read interpreted by Dr. Conley Rolls at [facility].   ASSESSMENT/PLAN: Encounter Diagnoses  Name Primary?  . Diabetes Yes  . HTN (hypertension)    D/w patient labs F/u with Dr. Patsy Lager in 1 month after she sees Dr. Audie Box about her endometriosis She is scheduled for June 25 HTN well controlled Declined med refills at this time, if she calls then would do 6 month refills.     Albirtha Grinage PHUONG, DO 10/21/2012 11:03 AM

## 2012-10-22 LAB — MICROALBUMIN, URINE: Microalb, Ur: 0.92 mg/dL (ref 0.00–1.89)

## 2012-11-05 NOTE — Telephone Encounter (Signed)
Patient has stopped taking metformin.  She is in pain on both sides.    [phone] or [phone]

## 2012-11-05 NOTE — Telephone Encounter (Signed)
Called her back.  She is frustrated by continued abdominal pain which is thought to be due to endometriosis. She had a negative abdominal CT on 5/26.  At this time I am not sure how best to help her.  She is seeing her OB on 6/25- I think she would be safe to wait until this appt as we have not seen any sign of a dangerous cause of her pain.  On the other hand, if she would like I am certainly glad to see her in the office tomorrow or anytime to check her.  I have not seen her personally in about 3 weeks, so I cannot advise her as to her abdominal pain or prescribe medication for this over the phone without a recheck.

## 2012-11-05 NOTE — Telephone Encounter (Signed)
She has seen GI Dr Loreta Ave and she has appt with the Gyn for the endometriosis. She has d/c her metformin as requested by the Endocrinologist. Patient letting us know she is still having pain. Please advise.

## 2012-11-11 ENCOUNTER — Encounter: Payer: Self-pay | Admitting: Gynecology

## 2012-11-11 ENCOUNTER — Ambulatory Visit (INDEPENDENT_AMBULATORY_CARE_PROVIDER_SITE_OTHER): Payer: 59 | Admitting: Gynecology

## 2012-11-11 VITALS — BP 130/80 | Ht 65.0 in | Wt 182.0 lb

## 2012-11-11 DIAGNOSIS — N949 Unspecified condition associated with female genital organs and menstrual cycle: Secondary | ICD-10-CM

## 2012-11-11 DIAGNOSIS — R102 Pelvic and perineal pain: Secondary | ICD-10-CM

## 2012-11-11 DIAGNOSIS — N809 Endometriosis, unspecified: Secondary | ICD-10-CM

## 2012-11-11 NOTE — Progress Notes (Signed)
Karen Foley 06/12/1965 [id number]        47 y.o.  G0P0 new patient with history of abdominopelvic pain. Started approximately a year ago seems to be getting worse. Occurs almost daily. Recall he starts the left lower quadrant but radiates across the mid abdomen to the right lower quadrant. Does not appear to have associated constipation diarrhea nausea vomiting no urinary symptoms such as frequency dysuria urgency. History of laparoscopy showing significant pelvic adhesions and endometriosis. Ultimately underwent TAH/BSO lysis of adhesions 03/2002 by Dr. Shelton Silvas. Description included ureterointestinal adhesions and bilateral sidewall adhesions involving the adnexa and cul-de-sac adhesions. These were all lysed and the adnexa freed to allow for the hysterectomy. She remembers being told afterwards that there were 3 areas on her intestines to have endometriosis that could not be resected. This has not been documented in her operative report. Pathology confirmed endometriosis and describe both the right and left ovaries. Patient has been having hot flashes since then. Never had been on HRT. Has been evaluated by her primary physician for the pain to include 2 negative CT scans of the abdomen, negative pelvic ultrasound, negative GI evaluation. Patient is virginal and notes that she has always had significant pain with vaginal insertion and that her menses had been disabling since menarche.  Past medical history,surgical history, medications, allergies, family history and social history were all reviewed and documented in the EPIC chart.  ROS:  Performed and pertinent positives and negatives are included in the history, assessment and plan .  Exam: Kim assistant Filed Vitals:   11/11/12 1539  BP: 130/80  Height: 5\' 5"  (1.651 m)  Weight: 182 lb (82.555 kg)   General appearance  Normal Skin grossly normal Head/Neck normal with no cervical or supraclavicular adenopathy thyroid normal Lungs   clear Cardiac RR, without RMG Abdominal  soft, nontender, without masses, organomegaly or hernia Breasts  examined lying and sitting without masses, retractions, discharge or axillary adenopathy. Pelvic  Ext/BUS/vagina  somewhat compromised in width and extremely tender to bimanual exam. Normal length.  Adnexa  Without masses or tenderness    Anus and perineum  normal   Rectovaginal  normal sphincter tone without palpated masses or tenderness.    Assessment/Plan:  47 y.o. G0P0 female with chronic lower abdominal pain. History of endometriosis status post TAH/BSO. Extensive evaluation recently to include CTs ultrasound and GI evaluation. Discussed that if indeed she had a TAH/BSO with complete removal of the ovaries that active endometriosis should not be a source of her pain but certainly adhesions could be. She does not appear though to be having bouts of intermittent obstruction. Possibilities for retained ovary also discussed although no evidence of this on CT or ultrasound. We'll check baseline estradiol and FSH. If evidence of retained ovaries hormonally possible suppression with Depo-Lupron reviewed. No symptoms to suggest interstitial cystitis. If surgery for adhesion lysis entertained I think the best choice would be general surgery as she has no gynecologic organs remaining in if bowel pathology encountered they would be able to address this with resection. More conservative treatment such as trial of amitriptyline at bedtime also considered. She had been given a trial of Bentyl without relief. Patient will followup with me after the hormone studies and then we'll go from there.    Dara Lords MD, 5:06 PM 11/11/2012

## 2012-11-11 NOTE — Patient Instructions (Signed)
office will call you with results

## 2012-11-12 ENCOUNTER — Telehealth: Payer: Self-pay | Admitting: Gynecology

## 2012-11-12 LAB — URINALYSIS W MICROSCOPIC + REFLEX CULTURE
Casts: NONE SEEN
Ketones, ur: NEGATIVE mg/dL
Leukocytes, UA: NEGATIVE
Nitrite: NEGATIVE
Protein, ur: NEGATIVE mg/dL
Urobilinogen, UA: 0.2 mg/dL (ref 0.0–1.0)
pH: 6 (ref 5.0–8.0)

## 2012-11-12 LAB — FOLLICLE STIMULATING HORMONE: FSH: 94.2 m[IU]/mL

## 2012-11-12 LAB — ESTRADIOL: Estradiol: 18.3 pg/mL

## 2012-11-12 NOTE — Telephone Encounter (Signed)
Erroneous encounter

## 2012-11-17 ENCOUNTER — Other Ambulatory Visit: Payer: Self-pay | Admitting: Gynecology

## 2012-11-17 ENCOUNTER — Telehealth: Payer: Self-pay | Admitting: Gynecology

## 2012-11-17 ENCOUNTER — Encounter: Payer: Self-pay | Admitting: Family Medicine

## 2012-11-17 MED ORDER — AMITRIPTYLINE HCL 25 MG PO TABS: 25.0000 mg | ORAL_TABLET | Freq: Every day | ORAL | Status: DC

## 2012-12-03 ENCOUNTER — Ambulatory Visit: Payer: 59 | Admitting: Nurse Practitioner

## 2012-12-07 ENCOUNTER — Ambulatory Visit (INDEPENDENT_AMBULATORY_CARE_PROVIDER_SITE_OTHER): Payer: 59 | Admitting: Surgery

## 2012-12-07 ENCOUNTER — Encounter (INDEPENDENT_AMBULATORY_CARE_PROVIDER_SITE_OTHER): Payer: Self-pay | Admitting: Surgery

## 2012-12-07 VITALS — BP 144/80 | HR 78 | Temp 98.6°F | Resp 16 | Ht 64.0 in | Wt 189.0 lb

## 2012-12-07 DIAGNOSIS — R1032 Left lower quadrant pain: Secondary | ICD-10-CM

## 2012-12-07 NOTE — Progress Notes (Signed)
Subjective:     Patient ID: Karen Foley, female   DOB: June 04, 1965, 47 y.o.   MRN: [id number]  HPI  Karen Foley  14-Jul-1965 [id number]  Patient Care Team: Collene Gobble, MD as PCP - General (Family Medicine)  This patient is a 47 y.o.female who presents today for surgical evaluation at the request of Dr. Audie Box.   Reason for visit: Persistent abdominal pain.  Question of adhesions.  Pleasant woman with prior abdominal surgeries.  She recalls being told she had significant endometriosis.  Had prior lysis of adhesions.  Then had hysterectomy and bilateral salpingo-oophorectomy.  That was in 2003.  She struggled with intermittent abdominal pain.  His primary the left lower quadrant.  It has become more constant.  It varies in intensity.  Not nurse worse with activity.  Walking can help.  No change with diet no bloating.  Some occasional nausea struggles with intermittent constipation and diarrhea.  No pain with defecation.  She took a bottle without difficulty.  No rectal bleeding.  She saw gastroenterology.  Colonoscopy was normal.  Has had CAT scans at this past year that are normal.  Denies heartburn or reflux of significance.  Pepcid usually controls it.  This is different.  No dysuria or hematuria or pyuria.  No history of kidney stones.  No history of fall or trauma.  No personal nor family history of GI/colon cancer, inflammatory bowel disease, irritable bowel syndrome, allergy such as Celiac Sprue, dietary/dairy problems, colitis, ulcers nor gastritis.  No recent sick contacts/gastroenteritis.  No travel outside the country.  No changes in diet.  Because of her persistent symptoms, She saw her gynecologist.  Multilevel supply postmenopausal state.  In their mind, this argues against endometriosis is a significant contributor.  They suspect adhesions are more likely to be the culprit.  Patient is no history of bowel obstructions.  She feels frustrated by the abdominal pain.   Therefore,  her gynecologist requested surgical evaluation.  Patient Active Problem List   Diagnosis Date Noted  . Type II or unspecified type diabetes mellitus without mention of complication, not stated as uncontrolled 10/07/2012  . 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 OSA on CPAP 02/04/2012  . Hypertension 02/04/2012  . Hyperlipidemia 02/04/2012  . Depression 02/04/2012    Past Medical History  Diagnosis Date  . Diabetes mellitus   . Hyperlipidemia   . Hypertension   . Depression   . Seizures   . Thyroid disease   . Asthma   . Endometriosis     Past Surgical History  Procedure Laterality Date  . Ovarian cyst removal    . Mastoidectomy    . Laproscopy    . Abdominal hysterectomy      TAH BSO    History   Social History  . Marital Status: Single    Spouse Name: N/A    Number of Children: N/A  . Years of Education: N/A   Occupational History  . Not on file.   Social History Main Topics  . Smoking status: Never Smoker   . Smokeless tobacco: Never Used  . Alcohol Use: No  . Drug Use: No  . Sexually Active: No   Other Topics Concern  . Not on file   Social History Narrative  . No narrative on file    Family History  Problem Relation Age of Onset  . Hypertension Mother   . Cancer Mother 80    breast  . Diabetes Mother   .  Cancer Father     prostate  . Diabetes Father   . Hypertension Father   . Goiter Maternal Grandmother   . Diabetes Maternal Grandmother   . Diabetes Maternal Grandfather   . Diabetes Paternal Grandmother     Current Outpatient Prescriptions  Medication Sig Dispense Refill  . amitriptyline (ELAVIL) 25 MG tablet Take 1 tablet (25 mg total) by mouth at bedtime.  30 tablet  0  . Armodafinil (NUVIGIL) 250 MG tablet Take 1 tablet (250 mg total) by mouth daily.  30 tablet  3  . Biotin 5000 MCG CAPS Take by mouth.      . citalopram (CELEXA) 20 MG tablet TAKE 1 AND 1/2 TABLETS DAILY  60 tablet  3   . Cranberry-Vitamin C-Vitamin E (CRANBERRY PLUS VITAMIN C) 4200-20-3 MG-MG-UNIT CAPS Take by mouth.      . CRESTOR 10 MG tablet TAKE 1 TABLET (10 MG TOTAL) BY MOUTH DAILY.  30 tablet  5  . desonide (DESOWEN) 0.05 % cream Apply topically daily. Apply to face and breast      . losartan (COZAAR) 50 MG tablet Take 1 tablet (50 mg total) by mouth daily.  30 tablet  11  . MINOXIDIL, TOPICAL, 5 % SOLN Apply topically at bedtime.      . nabumetone (RELAFEN) 500 MG tablet Take 500 mg by mouth 2 (two) times daily.      . polyethylene glycol powder (GLYCOLAX/MIRALAX) powder Take 17 g by mouth daily.  3350 g  1  . fish oil-omega-3 fatty acids 1000 MG capsule Take 2 g by mouth daily.      . metFORMIN (GLUCOPHAGE) 500 MG tablet Take 1 tablet (500 mg total) by mouth 2 (two) times daily with a meal.  180 tablet  3   No current facility-administered medications for this visit.     No Known Allergies  BP 144/80  Pulse 78  Temp(Src) 98.6 F (37 C) (Temporal)  Resp 16  Ht 5\' 4"  (1.626 m)  Wt 189 lb (85.73 kg)  BMI 32.43 kg/m2  No results found.   Review of Systems  Constitutional: Negative for fever, chills, diaphoresis, appetite change and fatigue.  HENT: Negative for ear pain, sore throat, trouble swallowing, neck pain and ear discharge.   Eyes: Negative for photophobia, discharge and visual disturbance.  Respiratory: Negative for cough, choking, chest tightness and shortness of breath.   Cardiovascular: Negative for chest pain and palpitations.  Gastrointestinal: Positive for abdominal pain, diarrhea and constipation. Negative for nausea, vomiting, abdominal distention, anal bleeding and rectal pain.  Endocrine: Negative for cold intolerance, heat intolerance, polydipsia and polyphagia.  Genitourinary: Positive for flank pain. Negative for dysuria, frequency, hematuria, decreased urine volume, vaginal bleeding, vaginal discharge, difficulty urinating and vaginal pain.  Musculoskeletal: Negative  for myalgias and gait problem.  Skin: Negative for color change, pallor and rash.  Allergic/Immunologic: Negative for environmental allergies, food allergies and immunocompromised state.  Neurological: Negative for dizziness, speech difficulty, weakness and numbness.  Hematological: Negative for adenopathy.  Psychiatric/Behavioral: Negative for confusion and agitation. The patient is not nervous/anxious.        Objective:   Physical Exam  Constitutional: She is oriented to person, place, and time. She appears well-developed and well-nourished. No distress.  HENT:  Head: Normocephalic.  Mouth/Throat: Oropharynx is clear and moist. No oropharyngeal exudate.  Eyes: Conjunctivae and EOM are normal. Pupils are equal, round, and reactive to light. No scleral icterus.  Neck: Normal range of motion. Neck supple. No  tracheal deviation present.  Cardiovascular: Normal rate, regular rhythm and intact distal pulses.   Pulmonary/Chest: Effort normal and breath sounds normal. No stridor. No respiratory distress. She exhibits no tenderness.  Abdominal: Soft. Bowel sounds are normal. She exhibits no distension, no abdominal bruit and no mass. There is tenderness in the left lower quadrant. There is no rigidity, no guarding, no CVA tenderness, no tenderness at McBurney's point and negative Murphy's sign. No hernia. Hernia confirmed negative in the right inguinal area and confirmed negative in the left inguinal area.    Genitourinary: No vaginal discharge found.  Musculoskeletal: Normal range of motion. She exhibits no tenderness.       Right elbow: She exhibits normal range of motion.       Left elbow: She exhibits normal range of motion.       Right wrist: She exhibits normal range of motion.       Left wrist: She exhibits normal range of motion.       Right hand: Normal strength noted.       Left hand: Normal strength noted.  Lymphadenopathy:       Head (right side): No posterior auricular adenopathy  present.       Head (left side): No posterior auricular adenopathy present.    She has no cervical adenopathy.    She has no axillary adenopathy.       Right: No inguinal adenopathy present.       Left: No inguinal adenopathy present.  Neurological: She is alert and oriented to person, place, and time. No cranial nerve deficit. She exhibits normal muscle tone. Coordination normal.  Skin: Skin is warm and dry. No rash noted. She is not diaphoretic. No erythema.  Psychiatric: Her speech is normal and behavior is normal. Judgment and thought content normal. Her mood appears not anxious. Her affect is not angry, not blunt and not inappropriate. Cognition and memory are normal. She exhibits a depressed mood.       Assessment:     Chronic left lower quadrant abdominal pain of uncertain etiology.  Extensive workup including normal CAT scans x2, normal colonoscopy, normal pelvic and vaginal ultrasounds.     Plan:     Perhaps diagnostic laparoscopy may be of benefit.  However, I am concerned That it may not resolve things.  Certainly she has had a thorough workup and still struggles.  I would like to her about some other etiologies first.   Therefore, I recommended an anti-inflammatory regimen with Aleve and ice/heat around the clock for the next three weeks.  I recommended bowel regimen with MiraLAX or other fiber supplement.  Consider adding on probiotic as well.  Goal is one soft bowel movement a day.  See if alterations in diarrhea/constipation can subside.  Increase amitriptyline double to 50 mg at bedtime.  If none of these help after 2-3 weeks, I will offer diagnostic laparoscopy to rule out other pathology & lysis of adhesions.  I discussed this with her:  The anatomy & physiology of the digestive tract was discussed.  The pathophysiology of abdominal pain was discussed.   I recommended abdominal exploration to diagnose & treat the source of the problem.  Laparoscopic & open techniques  were discussed.   Risks such as bleeding, infection, abscess, leak, reoperation, bowel resection, possible ostomy, hernia, heart attack, death, and other risks were discussed.   I expressed a medium likelihood that surgery will address the problem.    Goals of post-operative recovery were discussed  as well.  We will work to minimize complications.   Questions were answered.  The patient expressed understanding & wishes to consider surgery.

## 2012-12-07 NOTE — Patient Instructions (Addendum)
Get on a bowel regimen to help her bowels move more regularly.  Get an anti-inflammatory/pain regimen to help soreness calm down.  Increase amitriptyline to 50 mg at night.  If you are still struggling after using these for two weeks, consider laparoscopic expiration in the operating room to lyse adhesions and rule out other problems  GETTING TO GOOD BOWEL HEALTH. Irregular bowel habits such as constipation and diarrhea can lead to many problems over time.  Having one soft bowel movement a day is the most important way to prevent further problems.  The anorectal canal is designed to handle stretching and feces to safely manage our ability to get rid of solid waste (feces, poop, stool) out of our body.  BUT, hard constipated stools can act like ripping concrete bricks and diarrhea can be a burning fire to this very sensitive area of our body, causing inflamed hemorrhoids, anal fissures, increasing risk is perirectal abscesses, abdominal pain/bloating, an making irritable bowel worse.     The goal: ONE SOFT BOWEL MOVEMENT A DAY!  To have soft, regular bowel movements:    Drink at least 8 tall glasses of water a day.     Take plenty of fiber.  Fiber is the undigested part of plant food that passes into the colon, acting s "natures broom" to encourage bowel motility and movement.  Fiber can absorb and hold large amounts of water. This results in a larger, bulkier stool, which is soft and easier to pass. Work gradually over several weeks up to 6 servings a day of fiber (25g a day even more if needed) in the form of: o Vegetables -- Root (potatoes, carrots, turnips), leafy green (lettuce, salad greens, celery, spinach), or cooked high residue (cabbage, broccoli, etc) o Fruit -- Fresh (unpeeled skin & pulp), Dried (prunes, apricots, cherries, etc ),  or stewed ( applesauce)  o Whole grain breads, pasta, etc (whole wheat)  o Bran cereals    Bulking Agents -- This type of water-retaining fiber generally is  easily obtained each day by one of the following:  o Psyllium bran -- The psyllium plant is remarkable because its ground seeds can retain so much water. This product is available as Metamucil, Konsyl, Effersyllium, Per Diem Fiber, or the less expensive generic preparation in drug and health food stores. Although labeled a laxative, it really is not a laxative.  o Methylcellulose -- This is another fiber derived from wood which also retains water. It is available as Citrucel. o Polyethylene Glycol - and "artificial" fiber commonly called Miralax or Glycolax.  It is helpful for people with gassy or bloated feelings with regular fiber o Flax Seed - a less gassy fiber than psyllium   No reading or other relaxing activity while on the toilet. If bowel movements take longer than 5 minutes, you are too constipated   AVOID CONSTIPATION.  High fiber and water intake usually takes care of this.  Sometimes a laxative is needed to stimulate more frequent bowel movements, but    Laxatives are not a good long-term solution as it can wear the colon out. o Osmotics (Milk of Magnesia, Fleets phosphosoda, Magnesium citrate, MiraLax, GoLytely) are safer than  o Stimulants (Senokot, Castor Oil, Dulcolax, Ex Lax)    o Do not take laxatives for more than 7days in a row.    IF SEVERELY CONSTIPATED, try a Bowel Retraining Program: o Do not use laxatives.  o Eat a diet high in roughage, such as bran cereals and leafy  vegetables.  o Drink six (6) ounces of prune or apricot juice each morning.  o Eat two (2) large servings of stewed fruit each day.  o Take one (1) heaping tablespoon of a psyllium-based bulking agent twice a day. Use sugar-free sweetener when possible to avoid excessive calories.  o Eat a normal breakfast.  o Set aside 15 minutes after breakfast to sit on the toilet, but do not strain to have a bowel movement.  o If you do not have a bowel movement by the third day, use an enema and repeat the above steps.     Controlling diarrhea o Switch to liquids and simpler foods for a few days to avoid stressing your intestines further. o Avoid dairy products (especially milk & ice cream) for a short time.  The intestines often can lose the ability to digest lactose when stressed. o Avoid foods that cause gassiness or bloating.  Typical foods include beans and other legumes, cabbage, broccoli, and dairy foods.  Every person has some sensitivity to other foods, so listen to our body and avoid those foods that trigger problems for you. o Adding fiber (Citrucel, Metamucil, psyllium, Miralax) gradually can help thicken stools by absorbing excess fluid and retrain the intestines to act more normally.  Slowly increase the dose over a few weeks.  Too much fiber too soon can backfire and cause cramping & bloating. o Probiotics (such as active yogurt, Align, etc) may help repopulate the intestines and colon with normal bacteria and calm down a sensitive digestive tract.  Most studies show it to be of mild help, though, and such products can be costly. o Medicines:   Bismuth subsalicylate (ex. Kayopectate, Pepto Bismol) every 30 minutes for up to 6 doses can help control diarrhea.  Avoid if pregnant.   Loperamide (Immodium) can slow down diarrhea.  Start with two tablets (4mg  total) first and then try one tablet every 6 hours.  Avoid if you are having fevers or severe pain.  If you are not better or start feeling worse, stop all medicines and call your doctor for advice o Call your doctor if you are getting worse or not better.  Sometimes further testing (cultures, endoscopy, X-ray studies, bloodwork, etc) may be needed to help diagnose and treat the cause of the diarrhea.  Managing Pain  Pain after surgery or related to activity is often due to strain/injury to muscle, tendon, nerves and/or incisions.  This pain is usually short-term and will improve in a few months.   Many people find it helpful to do the following things  TOGETHER to help speed the process of healing and to get back to regular activity more quickly:  1. Avoid heavy physical activity a.  no lifting greater than 20 pounds b. Do not "push through" the pain.  Listen to your body and avoid positions and maneuvers than reproduce the pain c. Walking is okay as tolerated, but go slowly and stop when getting sore.  d. Remember: If it hurts to do it, then don't do it! 2. Take Anti-inflammatory medication  a. Take with food/snack around the clock for 1-2 weeks i. This helps the muscle and nerve tissues become less irritable and calm down faster b. Choose ONE of the following over-the-counter medications: i. Naproxen 220mg  tabs (ex. Aleve) 1-2 pills twice a day  ii. Ibuprofen 200mg  tabs (ex. Advil, Motrin) 3-4 pills with every meal and just before bedtime iii. Acetaminophen 500mg  tabs (Tylenol) 1-2 pills with every meal and just  before bedtime 3. Use a Heating pad or Ice/Cold Pack a. 4-6 times a day b. May use warm bath/hottub  or showers 4. Try Gentle Massage and/or Stretching  a. at the area of pain many times a day b. stop if you feel pain - do not overdo it  Try these steps together to help you body heal faster and avoid making things get worse.  Doing just one of these things may not be enough.    If you are not getting better after two weeks or are noticing you are getting worse, contact our office for further advice; we may need to re-evaluate you & see what other things we can do to help.

## 2012-12-08 ENCOUNTER — Encounter (INDEPENDENT_AMBULATORY_CARE_PROVIDER_SITE_OTHER): Payer: Self-pay | Admitting: Surgery

## 2012-12-08 ENCOUNTER — Telehealth (INDEPENDENT_AMBULATORY_CARE_PROVIDER_SITE_OTHER): Payer: Self-pay

## 2012-12-08 DIAGNOSIS — R109 Unspecified abdominal pain: Secondary | ICD-10-CM

## 2012-12-08 MED ORDER — AMITRIPTYLINE HCL 25 MG PO TABS: 50.0000 mg | ORAL_TABLET | Freq: Every day | ORAL | Status: DC

## 2012-12-08 NOTE — Telephone Encounter (Signed)
Refilled pt Amitriptyline 50mg  #30 to [facility] per Dr Michaell Cowing.

## 2012-12-14 ENCOUNTER — Other Ambulatory Visit (INDEPENDENT_AMBULATORY_CARE_PROVIDER_SITE_OTHER): Payer: Self-pay | Admitting: Surgery

## 2012-12-14 NOTE — Telephone Encounter (Signed)
Error

## 2013-01-11 ENCOUNTER — Encounter (INDEPENDENT_AMBULATORY_CARE_PROVIDER_SITE_OTHER): Payer: 59 | Admitting: Surgery

## 2013-02-17 ENCOUNTER — Ambulatory Visit: Payer: 59 | Admitting: Internal Medicine

## 2013-03-07 ENCOUNTER — Other Ambulatory Visit: Payer: Self-pay | Admitting: Physician Assistant

## 2013-03-07 ENCOUNTER — Other Ambulatory Visit: Payer: Self-pay | Admitting: Emergency Medicine

## 2013-03-25 ENCOUNTER — Other Ambulatory Visit: Payer: Self-pay

## 2013-04-08 ENCOUNTER — Telehealth: Payer: Self-pay

## 2013-04-08 NOTE — Telephone Encounter (Signed)
Pt requests copy of her records to pick up. States she needs from march 2014-June 2014. Regarding abdominal issues and please include referral. She needs for insurance purposes.  Please call when ready.  bf

## 2013-04-12 NOTE — Telephone Encounter (Signed)
Record ready for pick up. Needs to sign release form. Left voicemail for patient.

## 2013-05-12 ENCOUNTER — Ambulatory Visit (INDEPENDENT_AMBULATORY_CARE_PROVIDER_SITE_OTHER): Payer: 59 | Admitting: Internal Medicine

## 2013-05-12 ENCOUNTER — Encounter: Payer: Self-pay | Admitting: Internal Medicine

## 2013-05-12 VITALS — BP 146/90 | HR 72 | Temp 98.2°F | Resp 16 | Ht 64.0 in | Wt 194.0 lb

## 2013-05-12 DIAGNOSIS — E119 Type 2 diabetes mellitus without complications: Secondary | ICD-10-CM

## 2013-05-12 DIAGNOSIS — I10 Essential (primary) hypertension: Secondary | ICD-10-CM

## 2013-05-12 DIAGNOSIS — E785 Hyperlipidemia, unspecified: Secondary | ICD-10-CM

## 2013-05-12 DIAGNOSIS — G4733 Obstructive sleep apnea (adult) (pediatric): Secondary | ICD-10-CM

## 2013-05-12 DIAGNOSIS — Z9989 Dependence on other enabling machines and devices: Secondary | ICD-10-CM

## 2013-05-12 DIAGNOSIS — R1032 Left lower quadrant pain: Secondary | ICD-10-CM

## 2013-05-12 MED ORDER — LOSARTAN POTASSIUM 50 MG PO TABS: 50.0000 mg | ORAL_TABLET | Freq: Every day | ORAL | Status: DC

## 2013-05-12 MED ORDER — ROSUVASTATIN CALCIUM 10 MG PO TABS: ORAL_TABLET | ORAL | Status: DC

## 2013-05-12 MED ORDER — METFORMIN HCL ER 500 MG PO TB24: 500.0000 mg | ORAL_TABLET | Freq: Every day | ORAL | Status: DC

## 2013-05-12 NOTE — Patient Instructions (Addendum)
1-2  Weeks of bp readings .  And sent  [place] to help [place] decide on blood pressure management. Bring her monitor in to your next visit and we can check it again stars.  Back on  Metformin  Once a day and  Montoring.  bg  For 1-2 weeks fasting and then 2 hours after eating. Send [place] readings. Restart the Crestor At some point to try a true lactose illumination diet Get rid of the sugar in your drinks better to have food and drink your calories. Optimizsleep time Gentle exercise such as walking with good shoes on a regular basis may help body tone and some of your symptoms.  Laboratory studies and followup visit in 2-3 months or as needed.

## 2013-05-12 NOTE — Progress Notes (Signed)
Pre visit review using our clinic review tool, if applicable. No additional management support is needed unless otherwise documented below in the visit note. Chief Complaint  Patient presents with  . to establish  . Diabetes  . Hyperlipidemia  . Hypertension    HPI: Patient comes in as new patient visit . Previous care was  Dr Cleta Alberts  [facility]  Ongoing issues :  Dm : dx via dr Cleta Alberts    Had some se of sx .  Dizzy and   Se bp. Med also  Wasn't the metformin but not yet back on it Has been a while since checked sugars  .  Get battery.  No described complications   LIPIDS" crestor   Had se of some of other meds   Has been out of med for about a month at lease  Bp recenetly  A bit high  .  140 range.  Needs refill of med   bp monitor.not recently  OSA on CPAP  Via neurology [facility]  THyroid    Managed by dr Talmage Nap   This past year had onset of lower abd pain that has persisted and has had extensive evaluation short of surgery as to the cause .  Dr Audie Box doesn't think from endometriosis or gyne cause  Dr Michaell Cowing surgery ? Cause poss scar tissue but  At this time surgery is problematic  Option. For dx lap. Constipation and diarrhea at times  Has seen dr Loreta Ave also  And no medical dx noted  ls change  No change when stopped milk and probiotics .  Has been on elavil as a trial   recently feels like jurts all over and fatigue weak feeling    Is on ssri for depression?    ROS: See pertinent positives and negatives per HPI.  Past Medical History  Diagnosis Date  . Diabetes mellitus   . Hyperlipidemia   . Hypertension   . Depression   . Seizures   . Thyroid disease   . Asthma   . Endometriosis     s/p LOA 2003  . GERD (gastroesophageal reflux disease)   . Colon polyps   . Hx of varicella     Family History  Problem Relation Age of Onset  . Hypertension Mother   . Breast cancer Mother 53    breast  . Diabetes Mother   . Prostate cancer Father     prostate  . Diabetes Father    . Hypertension Father   . Goiter Maternal Grandmother   . Diabetes Maternal Grandmother   . Diabetes Maternal Grandfather   . Diabetes Paternal Grandmother     History   Social History  . Marital Status: Single    Spouse Name: N/A    Number of Children: N/A  . Years of Education: N/A   Social History Main Topics  . Smoking status: Never Smoker   . Smokeless tobacco: Never Used  . Alcohol Use: None  . Drug Use: None  . Sexual Activity: No   Other Topics Concern  . 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  amitriptyline (ELAVIL) 25 MG tablet Take 50 mg by mouth at bedtime as needed.  . Armodafinil 250 MG tablet Take 250 mg by mouth daily as needed.  . citalopram (CELEXA) 20 MG tablet TAKE 1 AND 1/2 TABLETS DAILY prn  . Cranberry-Vitamin C-Vitamin E (CRANBERRY PLUS VITAMIN C) 4200-20-3 MG-MG-UNIT CAPS Take by mouth.  . fish oil-omega-3 fatty acids 1000 MG capsule Take 2 g by mouth daily.  Marland Kitchen losartan (COZAAR) 50 MG tablet Take 1 tablet (50 mg total) by mouth daily.  Marland Kitchen MINOXIDIL, TOPICAL, 5 % SOLN Apply topically at bedtime.  . nabumetone (RELAFEN) 500 MG tablet Take 500 mg by mouth 2 (two) times daily.  . [DISCONTINUED] losartan (COZAAR) 50 MG tablet Take 1 tablet (50 mg total) by mouth daily. NEEDS OFFICE VISIT  . Biotin 5000 MCG CAPS Take by mouth.  . desonide (DESOWEN) 0.05 % cream Apply topically daily. Apply to face and breast  . metFORMIN (GLUCOPHAGE-XR) 500 MG 24 hr tablet Take 1 tablet (500 mg total) by mouth daily with breakfast.  . polyethylene glycol powder (GLYCOLAX/MIRALAX) powder Take 17 g by mouth daily.  . rosuvastatin (CRESTOR) 10 MG tablet TAKE 1 TABLET (10 MG TOTAL) BY MOUTH  DAILY.  . [DISCONTINUED] amitriptyline (ELAVIL) 25 MG tablet Take 2 tablets (50 mg total) by mouth at bedtime.  . [DISCONTINUED] Armodafinil (NUVIGIL) 250 MG tablet Take 1 tablet (250 mg total) by mouth daily.  . [DISCONTINUED] citalopram (CELEXA) 20 MG tablet TAKE 1 AND 1/2 TABLETS DAILY  . [DISCONTINUED] CRESTOR 10 MG tablet TAKE 1 TABLET (10 MG TOTAL) BY MOUTH DAILY.  . [DISCONTINUED] metFORMIN (GLUCOPHAGE) 500 MG tablet Take 1 tablet (500 mg total) by mouth 2 (two) times daily with a meal. NEEDS OFFICE VISIT    EXAM:  BP 146/90  Pulse 72  Temp(Src) 98.2 F (36.8 C)  Resp 16  Ht 5\' 4"  (1.626 m)  Wt 194 lb (87.998 kg)  BMI 33.28 kg/m2  Body mass index is 33.28 kg/(m^2).  Physical Exam: Vital signs reviewed ZOX:WRUE is a well-developed well-nourished alert cooperative  female who appears her stated age in no acute distress.  HEENT: normocephalic atraumatic , Eyes: PERRL EOM's full, conjunctiva clear, Nares: paten,t no deformity discharge or tenderness., Ears: no deformity EAC's clear TMs with normal landmarks. Mouth: clear OP, no lesions, edema.  Moist mucous membranes. Dentition in adequate repair. NECK: supple without masses,or bruits. noadenopathy CHEST/PULM:  Clear to auscultation and percussion breath sounds equal no wheeze , rales or rhonchi.  CV: PMI is nondisplaced, S1 S2 no gallops, murmurs, rubs. Peripheral pulses are full without delay.No JVD .  ABDOMEN: Bowel sounds normal nontender  No guard or rebound, no hepato splenomegal no CVA tenderness. Well healed scars  Extremtities:  No clubbing cyanosis or edema, no acute joint swelling or redness no focal atrophy NEURO:  Oriented x3, cranial nerves 3-12 appear to be intact, no obvious focal weakness,gait within normal limits no abnormal reflexes or asymmetrical SKIN: No acute rashes normal turgor, color, no bruising or petechiae. PSYCH: Oriented, good eye contact, no obvious depression anxiety, cognition and judgment appear  normal. LN: no cervical adenopathy   ASSESSMENT AND PLAN:  Discussed the following assessment and plan:  Type II or unspecified type diabetes mellitus without mention of complication, not stated as uncontrolled  Abdominal pain, LLQ (left lower quadrant)  Hyperlipidemia  Hypertension - get [place] readings  refill med   OSA on CPAP Counseled. importance of lsi at this time  Sleep and exercise  May help a number of issues and not likely ot aggravate her abd pain issue did not address mood  depressionand medication at this time  But functioning  Prolonged visit . Can visit at her next visit or as needed  overal seems to  Be off a number of meds for various reasons  Will reassess in 3 months . -Patient advised to return or notify health care team  if symptoms worsen or persist or new concerns arise. Will do record review also   Patient Instructions  1-2  Weeks of bp readings .  And sent  [place] to help [place] decide on blood pressure management. Bring her monitor in to your next visit and we can check it again stars.  Back on  Metformin  Once a day and  [place].  bg  For 1-2 weeks fasting and then 2 hours after eating. Send [place] readings. Restart the Crestor At some point to try a true lactose illumination diet Get rid of the sugar in your drinks better to have food and drink your calories. Optimizsleep time Gentle exercise such as walking with good shoes on a regular basis may help body tone and some of your symptoms.  Laboratory studies and followup visit in 2-3 months or as needed.    Neta Mends. Panosh M.D.

## 2013-05-15 ENCOUNTER — Encounter: Payer: Self-pay | Admitting: Internal Medicine

## 2013-05-15 NOTE — Assessment & Plan Note (Signed)
Uncertain cause  soe bowel changes  Poss scar tissue form past surgeries

## 2013-05-15 NOTE — Assessment & Plan Note (Signed)
Get back on metformin reviewed   Risk benefit of medication discussed.  check labs  lsi . Will address immuniz other at fuy bisit with labs

## 2013-05-15 NOTE — Assessment & Plan Note (Signed)
Up today get to goal refill med

## 2013-08-04 ENCOUNTER — Other Ambulatory Visit: Payer: 59

## 2013-08-11 ENCOUNTER — Ambulatory Visit: Payer: 59 | Admitting: Internal Medicine

## 2013-09-30 ENCOUNTER — Other Ambulatory Visit: Payer: 59

## 2013-10-06 ENCOUNTER — Ambulatory Visit: Payer: 59 | Admitting: Internal Medicine

## 2013-11-09 ENCOUNTER — Other Ambulatory Visit: Payer: 59

## 2013-11-15 ENCOUNTER — Ambulatory Visit: Payer: 59 | Admitting: Internal Medicine

## 2013-11-26 ENCOUNTER — Telehealth: Payer: Self-pay | Admitting: Internal Medicine

## 2013-11-26 DIAGNOSIS — E785 Hyperlipidemia, unspecified: Secondary | ICD-10-CM

## 2013-11-26 DIAGNOSIS — E119 Type 2 diabetes mellitus without complications: Secondary | ICD-10-CM

## 2013-11-26 NOTE — Telephone Encounter (Signed)
lmom for pt to cb. Pt needs lab appt for dm bundle a1c,lipid and then follow up with dr Fabian Sharppanosh

## 2013-12-16 NOTE — Telephone Encounter (Signed)
Attempted to call patient but had a bad connection Lipid, a1c, bmet, micro albumin ordered Messages sent to my chart Diabetic bundle

## 2013-12-22 NOTE — Telephone Encounter (Signed)
Left message on machine for patient to schedule an appointment to follow up with diabetes 

## 2013-12-31 NOTE — Telephone Encounter (Signed)
lmom for pt to cb

## 2014-01-07 NOTE — Telephone Encounter (Signed)
lmom for pt to cb

## 2014-01-20 NOTE — Telephone Encounter (Signed)
lmom for pt to cb

## 2014-03-04 ENCOUNTER — Other Ambulatory Visit: Payer: Self-pay

## 2014-05-30 ENCOUNTER — Encounter: Payer: Self-pay | Admitting: Internal Medicine

## 2014-05-30 ENCOUNTER — Ambulatory Visit (INDEPENDENT_AMBULATORY_CARE_PROVIDER_SITE_OTHER): Payer: 59 | Admitting: Internal Medicine

## 2014-05-30 VITALS — BP 130/80 | HR 71 | Temp 98.0°F | Wt 180.3 lb

## 2014-05-30 DIAGNOSIS — E119 Type 2 diabetes mellitus without complications: Secondary | ICD-10-CM

## 2014-05-30 DIAGNOSIS — F329 Major depressive disorder, single episode, unspecified: Secondary | ICD-10-CM

## 2014-05-30 DIAGNOSIS — Z9119 Patient's noncompliance with other medical treatment and regimen: Secondary | ICD-10-CM

## 2014-05-30 DIAGNOSIS — Z91199 Patient's noncompliance with other medical treatment and regimen due to unspecified reason: Secondary | ICD-10-CM

## 2014-05-30 DIAGNOSIS — G4733 Obstructive sleep apnea (adult) (pediatric): Secondary | ICD-10-CM

## 2014-05-30 DIAGNOSIS — E785 Hyperlipidemia, unspecified: Secondary | ICD-10-CM

## 2014-05-30 DIAGNOSIS — I10 Essential (primary) hypertension: Secondary | ICD-10-CM

## 2014-05-30 DIAGNOSIS — F32A Depression, unspecified: Secondary | ICD-10-CM

## 2014-05-30 DIAGNOSIS — Z23 Encounter for immunization: Secondary | ICD-10-CM

## 2014-05-30 DIAGNOSIS — Z9989 Dependence on other enabling machines and devices: Secondary | ICD-10-CM

## 2014-05-30 DIAGNOSIS — M419 Scoliosis, unspecified: Secondary | ICD-10-CM

## 2014-05-30 LAB — BASIC METABOLIC PANEL
BUN: 12 mg/dL (ref 6–23)
CALCIUM: 9.2 mg/dL (ref 8.4–10.5)
CHLORIDE: 100 meq/L (ref 96–112)
CO2: 26 mEq/L (ref 19–32)
CREATININE: 0.9 mg/dL (ref 0.4–1.2)
GFR: 86.65 mL/min (ref >60.00)
Glucose, Bld: 362 mg/dL — ABNORMAL HIGH (ref 70–99)
POTASSIUM: 3.9 meq/L (ref 3.5–5.1)
Sodium: 136 mEq/L (ref 135–145)

## 2014-05-30 LAB — CBC WITH DIFFERENTIAL/PLATELET
BASOS PCT: 0.7 % (ref 0.0–3.0)
Basophils Absolute: 0.1 10*3/uL (ref 0.0–0.1)
Eosinophils Absolute: 0.1 10*3/uL (ref 0.0–0.7)
Eosinophils Relative: 1.6 % (ref 0.0–5.0)
HCT: 45.1 % (ref 36.0–46.0)
Hemoglobin: 14.6 g/dL (ref 12.0–15.0)
LYMPHS PCT: 25.1 % (ref 12.0–46.0)
Lymphs Abs: 2 10*3/uL (ref 0.7–4.0)
MCHC: 32.4 g/dL (ref 30.0–36.0)
MCV: 91.3 fl (ref 78.0–100.0)
MONOS PCT: 5.9 % (ref 3.0–12.0)
Monocytes Absolute: 0.5 10*3/uL (ref 0.1–1.0)
Neutro Abs: 5.2 10*3/uL (ref 1.4–7.7)
Neutrophils Relative %: 66.7 % (ref 43.0–77.0)
Platelets: 217 10*3/uL (ref 150.0–400.0)
RBC: 4.94 Mil/uL (ref 3.87–5.11)
RDW: 13.9 % (ref 11.5–15.5)
WBC: 7.9 10*3/uL (ref 4.0–10.5)

## 2014-05-30 LAB — HEPATIC FUNCTION PANEL
ALT: 30 U/L (ref 0–35)
AST: 19 U/L (ref 0–37)
Albumin: 4.2 g/dL (ref 3.5–5.2)
Alkaline Phosphatase: 109 U/L (ref 39–117)
Bilirubin, Direct: 0.1 mg/dL (ref 0.0–0.3)
Total Bilirubin: 0.8 mg/dL (ref 0.2–1.2)
Total Protein: 7.4 g/dL (ref 6.0–8.3)

## 2014-05-30 LAB — LIPID PANEL
Cholesterol: 263 mg/dL — ABNORMAL HIGH (ref 0–200)
HDL: 49.2 mg/dL (ref >39.00)
LDL CALC: 194 mg/dL — AB (ref 0–99)
NonHDL: 213.8
TRIGLYCERIDES: 97 mg/dL (ref 0.0–149.0)
Total CHOL/HDL Ratio: 5
VLDL: 19.4 mg/dL (ref 0.0–40.0)

## 2014-05-30 LAB — TSH: TSH: 0.91 u[IU]/mL (ref 0.35–4.50)

## 2014-05-30 LAB — HEMOGLOBIN A1C: Hgb A1c MFr Bld: 14.6 % — ABNORMAL HIGH (ref 4.6–6.5)

## 2014-05-30 MED ORDER — LOSARTAN POTASSIUM 50 MG PO TABS
50.0000 mg | ORAL_TABLET | Freq: Every day | ORAL | Status: DC
Start: 2014-05-30 — End: 2014-08-29

## 2014-05-30 MED ORDER — METFORMIN HCL ER 500 MG PO TB24: 500.0000 mg | ORAL_TABLET | Freq: Every day | ORAL | Status: DC

## 2014-05-30 MED ORDER — ROSUVASTATIN CALCIUM 10 MG PO TABS: ORAL_TABLET | ORAL | Status: DC

## 2014-05-30 MED ORDER — ONETOUCH DELICA LANCETS FINE MISC: Status: AC

## 2014-05-30 MED ORDER — GLUCOSE BLOOD VI STRP: ORAL_STRIP | Status: DC

## 2014-05-30 MED ORDER — CITALOPRAM HYDROBROMIDE 20 MG PO TABS: ORAL_TABLET | ORAL | Status: DC

## 2014-05-30 NOTE — Patient Instructions (Addendum)
Will notify you  of labs when available. Need follow up soon.  Restart the celexa  20 mg per day ( can begin 10 mg per day ) then iuncrease  Plan ROV in 3 months or depending on labs     Diabetes and Standards of Medical Care Diabetes is complicated. You may find that your diabetes team includes a dietitian, nurse, diabetes educator, eye doctor, and more. To help everyone know what is going on and to help you get the care you deserve, the following schedule of care was developed to help keep you on track. Below are the tests, exams, vaccines, medicines, education, and plans you will need. HbA1c test This test shows how well you have controlled your glucose over the past 2-3 months. It is used to see if your diabetes management plan needs to be adjusted.   It is performed at least 2 times a year if you are meeting treatment goals.  It is performed 4 times a year if therapy has changed or if you are not meeting treatment goals. Blood pressure test  This test is performed at every routine medical visit. The goal is less than 140/90 mm Hg for most people, but 130/80 mm Hg in some cases. Ask your health care provider about your goal. Dental exam  Follow up with the dentist regularly. Eye exam  If you are diagnosed with type 1 diabetes as a child, get an exam upon reaching the age of 10 years or older and have had diabetes for 3-5 years. Yearly eye exams are recommended after that initial eye exam.  If you are diagnosed with type 1 diabetes as an adult, get an exam within 5 years of diagnosis and then yearly.  If you are diagnosed with type 2 diabetes, get an exam as soon as possible after the diagnosis and then yearly. Foot care exam  Visual foot exams are performed at every routine medical visit. The exams check for cuts, injuries, or other problems with the feet.  A comprehensive foot exam should be done yearly. This includes visual inspection as well as assessing foot pulses and  testing for loss of sensation.  Check your feet nightly for cuts, injuries, or other problems with your feet. Tell your health care provider if anything is not healing. Kidney function test (urine microalbumin)  This test is performed once a year.  Type 1 diabetes: The first test is performed 5 years after diagnosis.  Type 2 diabetes: The first test is performed at the time of diagnosis.  A serum creatinine and estimated glomerular filtration rate (eGFR) test is done once a year to assess the level of chronic kidney disease (CKD), if present. Lipid profile (cholesterol, HDL, LDL, triglycerides)  Performed every 5 years for most people.  The goal for LDL is less than 100 mg/dL. If you are at high risk, the goal is less than 70 mg/dL.  The goal for HDL is 40 mg/dL-50 mg/dL for men and 50 WU/AO-86 mg/dL for women. An HDL cholesterol of 60 mg/dL or higher gives some protection against heart disease.  The goal for triglycerides is less than 150 mg/dL. Influenza vaccine, pneumococcal vaccine, and hepatitis B vaccine  The influenza vaccine is recommended yearly.  It is recommended that people with diabetes who are over 109 years old get the pneumonia vaccine. In some cases, two separate shots may be given. Ask your health care provider if your pneumonia vaccination is up to date.  The hepatitis B vaccine is  also recommended for adults with diabetes. Diabetes self-management education  Education is recommended at diagnosis and ongoing as needed. Treatment plan  Your treatment plan is reviewed at every medical visit. Document Released: 03/03/2009 Document Revised: 09/20/2013 Document Reviewed: 10/06/2012 [employer] Patient Information 2015 [employer]. This information is not intended to replace advice given to you by your health care provider. Make sure you discuss any questions you have with your health care provider.  

## 2014-05-30 NOTE — Progress Notes (Signed)
Chief Complaint  Patient presents with  . Follow-up    last  visit over a year ago   . Diabetes  . Hyperlipidemia  . Hypertension    HPI: Karen Foley 49 y.o. comes in for yearly visit . HER initial visit was one year ago  And she did not folow up as adivsed in 2-3 months ,She has type 2 dm ht and osa on cpap  Followed by  Had a rough year issues and "diagnosed with scoliosis" and back problems? Did not present for fu or her disease conditions and not monitoring or taking med on a reg basis  .   Ran out of celexa and off for while  Placed on flexeril at some point    Sees Dr Chalmers Cater for thryoid? Yearly not diabetes  BP lower  Taking crestor  Dm not monitoring other  torrealba scoliosis  ROS: See pertinent positives and negatives per HPI. LIFESTYLE:  Exercise:   Tobacco/ETS: Alcohol: per day  Sugar beverages: Sleep: not treating ?  FU?OSA Drug use: no Pap ha hyst  mammo   Past Medical History  Diagnosis Date  . Diabetes mellitus   . Hyperlipidemia   . Hypertension   . Depression   . Seizures   . Thyroid disease   . Asthma   . Endometriosis     s/p LOA 2003  . GERD (gastroesophageal reflux disease)   . Colon polyps   . Hx of varicella     Family History  Problem Relation Age of Onset  . Hypertension Mother   . Breast cancer Mother 31    breast  . Diabetes Mother   . Prostate cancer Father     prostate  . Diabetes Father   . Hypertension Father   . Goiter Maternal Grandmother   . Diabetes Maternal Grandmother   . Diabetes Maternal Grandfather   . Diabetes Paternal Grandmother     History   Social History  . Marital Status: Single    Spouse Name: N/A    Number of Children: N/A  . Years of Education: N/A   Social History Main Topics  . Smoking status: Never Smoker   . Smokeless tobacco: Never Used  . Alcohol Use: None  . Drug Use: None  . Sexual Activity: No   Other Topics Concern  . 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 long busy schedule    No pets    Sleep 4-6 hours    negtad ocass caffiene   No ets fa   G0P0    Outpatient Encounter Prescriptions as of 05/30/2014  Medication Sig  . amitriptyline (ELAVIL) 25 MG tablet Take 50 mg by mouth at bedtime as needed.  . Armodafinil 250 MG tablet Take 250 mg by mouth daily as needed.  . Biotin 5000 MCG CAPS Take by mouth.  . citalopram (CELEXA) 20 MG tablet TAKE 1  Po qd  Or as diercted  . cyclobenzaprine (FLEXERIL) 10 MG tablet Take 10 mg by mouth 3 (three) times daily as needed for muscle spasms.  Marland Kitchen desonide (DESOWEN) 0.05 % cream Apply topically daily. Apply to face and breast  . fish oil-omega-3 fatty acids 1000 MG capsule Take 2 g by mouth daily.  Marland Kitchen losartan (COZAAR) 50 MG tablet Take 1 tablet (50 mg total) by mouth daily.  Marland Kitchen  metFORMIN (GLUCOPHAGE-XR) 500 MG 24 hr tablet Take 1 tablet (500 mg total) by mouth daily with breakfast.  . MINOXIDIL, TOPICAL, 5 % SOLN Apply topically at bedtime.  . polyethylene glycol powder (GLYCOLAX/MIRALAX) powder Take 17 g by mouth daily.  . rosuvastatin (CRESTOR) 10 MG tablet TAKE 1 TABLET (10 MG TOTAL) BY MOUTH DAILY.  . [DISCONTINUED] citalopram (CELEXA) 20 MG tablet TAKE 1 AND 1/2 TABLETS DAILY prn  . [DISCONTINUED] losartan (COZAAR) 50 MG tablet Take 1 tablet (50 mg total) by mouth daily.  . [DISCONTINUED] metFORMIN (GLUCOPHAGE-XR) 500 MG 24 hr tablet Take 1 tablet (500 mg total) by mouth daily with breakfast.  . [DISCONTINUED] rosuvastatin (CRESTOR) 10 MG tablet TAKE 1 TABLET (10 MG TOTAL) BY MOUTH DAILY.  Marland Kitchen Cranberry-Vitamin C-Vitamin E (CRANBERRY PLUS VITAMIN C) 4200-20-3 MG-MG-UNIT CAPS Take by mouth.  Marland Kitchen glucose blood (ONE TOUCH TEST STRIPS) test strip Test daily.  . nabumetone (RELAFEN) 500 MG tablet Take 500 mg by mouth 2 (two) times daily.  Glory Rosebush DELICA LANCETS FINE MISC Test once daily    EXAM:  BP 130/80  mmHg  Pulse 71  Temp(Src) 98 F (36.7 C) (Oral)  Wt 180 lb 4.8 oz (81.784 kg)  SpO2 97%  Body mass index is 30.93 kg/(m^2).  GENERAL: vitals reviewed and listed above, alert, oriented, appears well hydrated and in no acute distress mildly depressed looking  Cognition in tact and non toxic HEENT: atraumatic, conjunctiva  clear, no obvious abnormalities on inspection of external nose and ears OP : no lesion edema or exudate  NECK: no obvious masses on inspection palpation  LUNGS: clear to auscultation bilaterally, no wheezes, rales or rhonchi, good air movement Abdomen:  Sof,t normal bowel sounds without hepatosplenomegaly, no guarding rebound or masses no CVA tenderness CV: HRRR, no clubbing cyanosis or  peripheral edema nl cap refill  MS: moves all extremities without noticeable focal  Abnormality PSYCH: pleasant and cooperative,     ASSESSMENT AND PLAN:  Discussed the following assessment and plan:  Diabetes mellitus without complication - failed to fu and off metformin for months - Plan: Basic metabolic panel, CBC with Differential, Hemoglobin A1c, Hepatic function panel, Lipid panel, TSH  Essential hypertension - taking med most days  - Plan: Basic metabolic panel, CBC with Differential, Hemoglobin A1c, Hepatic function panel, Lipid panel, TSH  Hyperlipidemia - Plan: Basic metabolic panel, CBC with Differential, Hemoglobin A1c, Hepatic function panel, Lipid panel, TSH  OSA on CPAP - not fu  - Plan: Basic metabolic panel, CBC with Differential, Hemoglobin A1c, Hepatic function panel, Lipid panel, TSH  Need for prophylactic vaccination and inoculation against influenza - Plan: Flu Vaccine QUAD 36+ mos PF IM (Fluarix Quad PF)  Type 2 diabetes mellitus without complication - Plan: ONETOUCH DELICA LANCETS FINE MISC, glucose blood (ONE TOUCH TEST STRIPS) test strip  Depression  Scoliosis  Patient non adherence - pt back fu to get back on meds Not doing sleep apnea  Was on  prednisone   For 6 doses  Effected early novemeber  Have her get back onmeds and evaluate  And fu depending on labs . Total visit 39mns > 50% spent counseling and coordinating care     -Patient advised to return or notify health care team  if symptoms worsen ,persist or new concerns arise.  Patient Instructions   Will notify you  of labs when available. Need follow up soon.  Restart the celexa  20 mg per day ( can begin 10 mg per day )  then [place] in 3 months or depending on labs     Diabetes and Standards of Medical Care Diabetes is complicated. You may find that your diabetes team includes a dietitian, nurse, diabetes educator, eye doctor, and more. To help everyone know what is going on and to help you get the care you deserve, the following schedule of care was developed to help keep you on track. Below are the tests, exams, vaccines, medicines, education, and plans you will need. HbA1c test This test shows how well you have controlled your glucose over the past 2-3 months. It is used to see if your diabetes management plan needs to be adjusted.   It is performed at least 2 times a year if you are meeting treatment goals.  It is performed 4 times a year if therapy has changed or if you are not meeting treatment goals. Blood pressure test  This test is performed at every routine medical visit. The goal is less than 140/90 mm Hg for most people, but 130/80 mm Hg in some cases. Ask your health care provider about your goal. Dental exam  Follow up with the dentist regularly. Eye exam  If you are diagnosed with type 1 diabetes as a child, get an exam upon reaching the age of 84 years or older and have had diabetes for 3-5 years. Yearly eye exams are recommended after that initial eye exam.  If you are diagnosed with type 1 diabetes as an adult, get an exam within 5 years of diagnosis and then yearly.  If you are diagnosed with type 2 diabetes, get an exam as soon as  possible after the diagnosis and then yearly. Foot care exam  Visual foot exams are performed at every routine medical visit. The exams check for cuts, injuries, or other problems with the feet.  A comprehensive foot exam should be done yearly. This includes visual inspection as well as assessing foot pulses and testing for loss of sensation.  Check your feet nightly for cuts, injuries, or other problems with your feet. Tell your health care provider if anything is not healing. Kidney function test (urine microalbumin)  This test is performed once a year.  Type 1 diabetes: The first test is performed 5 years after diagnosis.  Type 2 diabetes: The first test is performed at the time of diagnosis.  A serum creatinine and estimated glomerular filtration rate (eGFR) test is done once a year to assess the level of chronic kidney disease (CKD), if present. Lipid profile (cholesterol, HDL, LDL, triglycerides)  Performed every 5 years for most people.  The goal for LDL is less than 100 mg/dL. If you are at high risk, the goal is less than 70 mg/dL.  The goal for HDL is 40 mg/dL-50 mg/dL for men and 50 mg/dL-60 mg/dL for women. An HDL cholesterol of 60 mg/dL or higher gives some protection against heart disease.  The goal for triglycerides is less than 150 mg/dL. Influenza vaccine, pneumococcal vaccine, and hepatitis B vaccine  The influenza vaccine is recommended yearly.  It is recommended that people with diabetes who are over 28 years old get the pneumonia vaccine. In some cases, two separate shots may be given. Ask your health care provider if your pneumonia vaccination is up to date.  The hepatitis B vaccine is also recommended for adults with diabetes. Diabetes self-management education  Education is recommended at diagnosis and ongoing as needed. Treatment plan  Your treatment plan is reviewed at every  medical visit. Document Released: 03/03/2009 Document Revised: 09/20/2013  Document Reviewed: 10/06/2012 [facility] Patient Information 2015 [place], [place]. This information is not intended to replace advice given to you by your health care provider. Make sure you discuss any questions you have with your health care provider.      Standley Brooking. Celeste Tavenner M.D.  Pre visit review using our clinic review tool, if applicable. No additional management support is needed unless otherwise documented below in the visit note.

## 2014-05-31 ENCOUNTER — Telehealth: Payer: Self-pay | Admitting: Internal Medicine

## 2014-05-31 NOTE — Telephone Encounter (Signed)
emmi emailed °

## 2014-06-05 DIAGNOSIS — M419 Scoliosis, unspecified: Secondary | ICD-10-CM | POA: Insufficient documentation

## 2014-06-06 ENCOUNTER — Other Ambulatory Visit: Payer: Self-pay | Admitting: Family Medicine

## 2014-06-06 DIAGNOSIS — E119 Type 2 diabetes mellitus without complications: Secondary | ICD-10-CM

## 2014-07-29 ENCOUNTER — Telehealth: Payer: Self-pay | Admitting: Internal Medicine

## 2014-07-29 MED ORDER — METFORMIN HCL ER 500 MG PO TB24: 1000.0000 mg | ORAL_TABLET | Freq: Every day | ORAL | Status: DC

## 2014-07-29 NOTE — Telephone Encounter (Signed)
Pt is taking metformin 500 mg twice a day and needs new rx #60 with refills [facility] to [facility] [address]. Pt only has 2 pills left

## 2014-07-29 NOTE — Telephone Encounter (Signed)
Sent to the pharmacy by e-scribe. 

## 2014-08-29 ENCOUNTER — Ambulatory Visit (INDEPENDENT_AMBULATORY_CARE_PROVIDER_SITE_OTHER): Payer: 59 | Admitting: Internal Medicine

## 2014-08-29 ENCOUNTER — Encounter: Payer: Self-pay | Admitting: Internal Medicine

## 2014-08-29 VITALS — BP 148/92 | Temp 98.2°F | Ht 64.0 in | Wt 177.8 lb

## 2014-08-29 DIAGNOSIS — I10 Essential (primary) hypertension: Secondary | ICD-10-CM | POA: Diagnosis not present

## 2014-08-29 DIAGNOSIS — E785 Hyperlipidemia, unspecified: Secondary | ICD-10-CM | POA: Diagnosis not present

## 2014-08-29 DIAGNOSIS — IMO0001 Reserved for inherently not codable concepts without codable children: Secondary | ICD-10-CM

## 2014-08-29 DIAGNOSIS — E1165 Type 2 diabetes mellitus with hyperglycemia: Secondary | ICD-10-CM

## 2014-08-29 LAB — LIPID PANEL
CHOL/HDL RATIO: 4
Cholesterol: 185 mg/dL (ref 0–200)
HDL: 49.5 mg/dL (ref >39.00)
LDL CALC: 122 mg/dL — AB (ref 0–99)
NonHDL: 135.5
Triglycerides: 70 mg/dL (ref 0.0–149.0)
VLDL: 14 mg/dL (ref 0.0–40.0)

## 2014-08-29 LAB — BASIC METABOLIC PANEL
BUN: 18 mg/dL (ref 6–23)
CO2: 26 mEq/L (ref 19–32)
Calcium: 9.1 mg/dL (ref 8.4–10.5)
Chloride: 105 mEq/L (ref 96–112)
Creatinine, Ser: 0.84 mg/dL (ref 0.40–1.20)
GFR: 92.53 mL/min (ref >60.00)
Glucose, Bld: 120 mg/dL — ABNORMAL HIGH (ref 70–99)
POTASSIUM: 4 meq/L (ref 3.5–5.1)
Sodium: 139 mEq/L (ref 135–145)

## 2014-08-29 LAB — HEMOGLOBIN A1C: HEMOGLOBIN A1C: 8.2 % — AB (ref 4.6–6.5)

## 2014-08-29 MED ORDER — METFORMIN HCL ER 500 MG PO TB24: 1000.0000 mg | ORAL_TABLET | Freq: Every day | ORAL | Status: DC

## 2014-08-29 MED ORDER — LOSARTAN POTASSIUM 100 MG PO TABS: 100.0000 mg | ORAL_TABLET | Freq: Every day | ORAL | Status: DC

## 2014-08-29 NOTE — Patient Instructions (Addendum)
Need to be on twice a day .  Metformin at least.   Increase cozaar  100 mg  Per day  For hypertension ( can take 2 - 50 mg )  Goal bp below 140/90  130/80 and lower is better .  Should check glucose readings once a day until making sure undercontrol. Will refer to dr Chalmers Cater about the diabetes .    Diabetes and Standards of Medical Care Diabetes is complicated. You may find that your diabetes team includes a dietitian, nurse, diabetes educator, eye doctor, and more. To help everyone know what is going on and to help you get the care you deserve, the following schedule of care was developed to help keep you on track. Below are the tests, exams, vaccines, medicines, education, and plans you will need. HbA1c test This test shows how well you have controlled your glucose over the past 2-3 months. It is used to see if your diabetes management plan needs to be adjusted.   It is performed at least 2 times a year if you are meeting treatment goals.  It is performed 4 times a year if therapy has changed or if you are not meeting treatment goals. Blood pressure test  This test is performed at every routine medical visit. The goal is less than 140/90 mm Hg for most people, but 130/80 mm Hg in some cases. Ask your health care provider about your goal. Dental exam  Follow up with the dentist regularly. Eye exam  If you are diagnosed with type 1 diabetes as a child, get an exam upon reaching the age of 54 years or older and have had diabetes for 3-5 years. Yearly eye exams are recommended after that initial eye exam.  If you are diagnosed with type 1 diabetes as an adult, get an exam within 5 years of diagnosis and then yearly.  If you are diagnosed with type 2 diabetes, get an exam as soon as possible after the diagnosis and then yearly. Foot care exam  Visual foot exams are performed at every routine medical visit. The exams check for cuts, injuries, or other problems with the feet.  A  comprehensive foot exam should be done yearly. This includes visual inspection as well as assessing foot pulses and testing for loss of sensation.  Check your feet nightly for cuts, injuries, or other problems with your feet. Tell your health care provider if anything is not healing. Kidney function test (urine microalbumin)  This test is performed once a year.  Type 1 diabetes: The first test is performed 5 years after diagnosis.  Type 2 diabetes: The first test is performed at the time of diagnosis.  A serum creatinine and estimated glomerular filtration rate (eGFR) test is done once a year to assess the level of chronic kidney disease (CKD), if present. Lipid profile (cholesterol, HDL, LDL, triglycerides)  Performed every 5 years for most people.  The goal for LDL is less than 100 mg/dL. If you are at high risk, the goal is less than 70 mg/dL.  The goal for HDL is 40 mg/dL-50 mg/dL for men and 50 mg/dL-60 mg/dL for women. An HDL cholesterol of 60 mg/dL or higher gives some protection against heart disease.  The goal for triglycerides is less than 150 mg/dL. Influenza vaccine, pneumococcal vaccine, and hepatitis B vaccine  The influenza vaccine is recommended yearly.  It is recommended that people with diabetes who are over 23 years old get the pneumonia vaccine. In some cases, two  separate shots may be given. Ask your health care provider if your pneumonia vaccination is up to date.  The hepatitis B vaccine is also recommended for adults with diabetes. Diabetes self-management education  Education is recommended at diagnosis and ongoing as needed. Treatment plan  Your treatment plan is reviewed at every medical visit. Document Released: 03/03/2009 Document Revised: 09/20/2013 Document Reviewed: 10/06/2012 [facility] Patient Information 2015 [place], [place]. This information is not intended to replace advice given to you by your health care provider. Make sure you discuss any  questions you have with your health care provider.

## 2014-08-29 NOTE — Progress Notes (Signed)
Pre visit review using our clinic review tool, if applicable. No additional management support is needed unless otherwise documented below in the visit note.  Chief Complaint  Patient presents with  . Follow-up    HPI: Karen Foley 49 y.o. comesin for Chronic disease management  hadnt been seen in 2 years with dx of dm and didn't take meds at taht time  In jan a1c was  14.6    Was to see dr Chalmers Cater  For dm and thyroid   Only taking once a day   cause says this .   BG  120 range   Checking in  About once a week.   In am .  Before eating breakfast   No vision loss.   Bp stays high    Elevated .  From stress  Just got off work . Nit checking at home  Taking meds  ROS: See pertinent positives and negatives per HPI. Stress and weight loss from that   Past Medical History  Diagnosis Date  . Diabetes mellitus   . Hyperlipidemia   . Hypertension   . Depression   . Seizures   . Thyroid disease   . Asthma   . Endometriosis     s/p LOA 2003  . GERD (gastroesophageal reflux disease)   . Colon polyps   . Hx of varicella     Family History  Problem Relation Age of Onset  . Hypertension Mother   . Breast cancer Mother 69    breast  . Diabetes Mother   . Prostate cancer Father     prostate  . Diabetes Father   . Hypertension Father   . Goiter Maternal Grandmother   . Diabetes Maternal Grandmother   . Diabetes Maternal Grandfather   . Diabetes Paternal Grandmother     History   Social History  . Marital Status: Single    Spouse Name: N/A  . Number of Children: N/A  . Years of Education: N/A   Social History Main Topics  . Smoking status: Never Smoker   . Smokeless tobacco: Never Used  . Alcohol Use: Not on file  . Drug Use: Not on file  . Sexual Activity: No   Other Topics Concern  . 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 long busy schedule    No pets    Sleep 4-6 hours    negtad ocass caffiene   No ets fa   G0P0    Outpatient Encounter Prescriptions as of 08/29/2014  Medication Sig  . citalopram (CELEXA) 20 MG tablet TAKE 1  Po qd  Or as diercted  . cyclobenzaprine (FLEXERIL) 10 MG tablet Take 10 mg by mouth 3 (three) times daily as needed for muscle spasms.  Marland Kitchen desonide (DESOWEN) 0.05 % cream Apply topically daily. Apply to face and breast  . fish oil-omega-3 fatty acids 1000 MG capsule Take 2 g by mouth daily.  Marland Kitchen glucose blood (ONE TOUCH TEST STRIPS) test strip Test daily.  . metFORMIN (GLUCOPHAGE-XR) 500 MG 24 hr tablet Take 2 tablets (1,000 mg total) by mouth daily with breakfast.  . ONETOUCH DELICA LANCETS FINE MISC Test once daily  . polyethylene glycol powder (GLYCOLAX/MIRALAX) powder Take 17 g by mouth daily.  . rosuvastatin (CRESTOR) 10 MG tablet TAKE 1 TABLET (10 MG TOTAL) BY MOUTH DAILY.  . [  DISCONTINUED] losartan (COZAAR) 50 MG tablet Take 1 tablet (50 mg total) by mouth daily.  . [DISCONTINUED] metFORMIN (GLUCOPHAGE-XR) 500 MG 24 hr tablet Take 2 tablets (1,000 mg total) by mouth daily with breakfast.  . losartan (COZAAR) 100 MG tablet Take 1 tablet (100 mg total) by mouth daily.  . [DISCONTINUED] amitriptyline (ELAVIL) 25 MG tablet Take 50 mg by mouth at bedtime as needed.  . [DISCONTINUED] Armodafinil 250 MG tablet Take 250 mg by mouth daily as needed.  . [DISCONTINUED] Biotin 5000 MCG CAPS Take by mouth.  . [DISCONTINUED] Cranberry-Vitamin C-Vitamin E (CRANBERRY PLUS VITAMIN C) 4200-20-3 MG-MG-UNIT CAPS Take by mouth.  . [DISCONTINUED] MINOXIDIL, TOPICAL, 5 % SOLN Apply topically at bedtime.  . [DISCONTINUED] nabumetone (RELAFEN) 500 MG tablet Take 500 mg by mouth 2 (two) times daily.    EXAM:  BP 148/92 mmHg  Temp(Src) 98.2 F (36.8 C) (Oral)  Ht 5' 4"  (1.626 m)  Wt 177 lb 12.8 oz (80.65 kg)  BMI 30.50 kg/m2  Body mass index is 30.5 kg/(m^2).  GENERAL: vitals reviewed and listed above, alert,  oriented, appears well hydrated and in no acute distress HEENT: atraumatic, conjunctiva  clear, no obvious abnormalities on inspection of external nose and ears LUNGS: clear to auscultation bilaterally, no wheezes, rales or rhonchi, good air movement CV: HRRR, no clubbing cyanosis ornl cap refill  MS: moves all extremities without noticeable focal  abnormality PSYCH: pleasant and cooperative, no obvious depression or anxiety Lab Results  Component Value Date   WBC 7.9 05/30/2014   HGB 14.6 05/30/2014   HCT 45.1 05/30/2014   PLT 217.0 05/30/2014   GLUCOSE 362* 05/30/2014   CHOL 263* 05/30/2014   TRIG 97.0 05/30/2014   HDL 49.20 05/30/2014   LDLCALC 194* 05/30/2014   ALT 30 05/30/2014   AST 19 05/30/2014   NA 136 05/30/2014   K 3.9 05/30/2014   CL 100 05/30/2014   CREATININE 0.9 05/30/2014   BUN 12 05/30/2014   CO2 26 05/30/2014   TSH 0.91 05/30/2014   HGBA1C 14.6* 05/30/2014   MICROALBUR 0.92 10/21/2012    ASSESSMENT AND PLAN:  Discussed the following assessment and plan:  Diabetes mellitus type 2, uncontrolled, without complications - Plan: Basic metabolic panel, Hemoglobin A1c, Lipid panel  Essential hypertension - intenisifcaltion   inc to 100 mg per day  - Plan: Basic metabolic panel, Hemoglobin A1c, Lipid panel  Hyperlipidemia - Plan: Basic metabolic panel, Hemoglobin A1c, Lipid panel Back on meds but mix up at pharmacy on bid metformin  recnetly up to 1020m  Says checking bg once a week and 115 this am and has new meter .   Plan referral to dr BChalmers Cater for the dm  -Patient advised to return or notify health care team  if symptoms worsen ,persist or new concerns arise.  Patient Instructions  Need to be on twice a day .  Metformin at least.   Increase cozaar  100 mg  Per day  For hypertension ( can take 2 - 50 mg )  Goal bp below 140/90  130/80 and lower is better .  Should check glucose readings once a day until making sure undercontrol. Will refer to dr BChalmers Cater about the diabetes .    Diabetes and Standards of Medical Care Diabetes is complicated. You may find that your diabetes team includes a dietitian, nurse, diabetes educator, eye doctor, and more. To help everyone know what is going on and to help you get the care you  deserve, the following schedule of care was developed to help keep you on track. Below are the tests, exams, vaccines, medicines, education, and plans you will need. HbA1c test This test shows how well you have controlled your glucose over the past 2-3 months. It is used to see if your diabetes management plan needs to be adjusted.   It is performed at least 2 times a year if you are meeting treatment goals.  It is performed 4 times a year if therapy has changed or if you are not meeting treatment goals. Blood pressure test  This test is performed at every routine medical visit. The goal is less than 140/90 mm Hg for most people, but 130/80 mm Hg in some cases. Ask your health care provider about your goal. Dental exam  Follow up with the dentist regularly. Eye exam  If you are diagnosed with type 1 diabetes as a child, get an exam upon reaching the age of 33 years or older and have had diabetes for 3-5 years. Yearly eye exams are recommended after that initial eye exam.  If you are diagnosed with type 1 diabetes as an adult, get an exam within 5 years of diagnosis and then yearly.  If you are diagnosed with type 2 diabetes, get an exam as soon as possible after the diagnosis and then yearly. Foot care exam  Visual foot exams are performed at every routine medical visit. The exams check for cuts, injuries, or other problems with the feet.  A comprehensive foot exam should be done yearly. This includes visual inspection as well as assessing foot pulses and testing for loss of sensation.  Check your feet nightly for cuts, injuries, or other problems with your feet. Tell your health care provider if anything is not  healing. Kidney function test (urine microalbumin)  This test is performed once a year.  Type 1 diabetes: The first test is performed 5 years after diagnosis.  Type 2 diabetes: The first test is performed at the time of diagnosis.  A serum creatinine and estimated glomerular filtration rate (eGFR) test is done once a year to assess the level of chronic kidney disease (CKD), if present. Lipid profile (cholesterol, HDL, LDL, triglycerides)  Performed every 5 years for most people.  The goal for LDL is less than 100 mg/dL. If you are at high risk, the goal is less than 70 mg/dL.  The goal for HDL is 40 mg/dL-50 mg/dL for men and 50 mg/dL-60 mg/dL for women. An HDL cholesterol of 60 mg/dL or higher gives some protection against heart disease.  The goal for triglycerides is less than 150 mg/dL. Influenza vaccine, pneumococcal vaccine, and hepatitis B vaccine  The influenza vaccine is recommended yearly.  It is recommended that people with diabetes who are over 82 years old get the pneumonia vaccine. In some cases, two separate shots may be given. Ask your health care provider if your pneumonia vaccination is up to date.  The hepatitis B vaccine is also recommended for adults with diabetes. Diabetes self-management education  Education is recommended at diagnosis and ongoing as needed. Treatment plan  Your treatment plan is reviewed at every medical visit. Document Released: 03/03/2009 Document Revised: 09/20/2013 Document Reviewed: 10/06/2012 [facility] Patient Information 2015 [place], [place]. This information is not intended to replace advice given to you by your health care provider. Make sure you discuss any questions you have with your health care provider.      Standley Brooking. Rayni Nemitz M.D.

## 2014-11-14 ENCOUNTER — Other Ambulatory Visit: Payer: Self-pay

## 2014-12-01 ENCOUNTER — Encounter (HOSPITAL_COMMUNITY): Payer: Self-pay | Admitting: Emergency Medicine

## 2014-12-01 ENCOUNTER — Emergency Department (HOSPITAL_COMMUNITY)
Admission: EM | Admit: 2014-12-01 | Discharge: 2014-12-01 | Disposition: A | Discharge status: Home / Self Care | Payer: 59 | Attending: Emergency Medicine | Admitting: Emergency Medicine

## 2014-12-01 DIAGNOSIS — Z86010 Personal history of colonic polyps: Secondary | ICD-10-CM | POA: Insufficient documentation

## 2014-12-01 DIAGNOSIS — Z8742 Personal history of other diseases of the female genital tract: Secondary | ICD-10-CM | POA: Insufficient documentation

## 2014-12-01 DIAGNOSIS — Z8719 Personal history of other diseases of the digestive system: Secondary | ICD-10-CM | POA: Diagnosis not present

## 2014-12-01 DIAGNOSIS — E785 Hyperlipidemia, unspecified: Secondary | ICD-10-CM | POA: Insufficient documentation

## 2014-12-01 DIAGNOSIS — Z7952 Long term (current) use of systemic steroids: Secondary | ICD-10-CM | POA: Diagnosis not present

## 2014-12-01 DIAGNOSIS — R51 Headache: Secondary | ICD-10-CM | POA: Insufficient documentation

## 2014-12-01 DIAGNOSIS — J45909 Unspecified asthma, uncomplicated: Secondary | ICD-10-CM | POA: Diagnosis not present

## 2014-12-01 DIAGNOSIS — I160 Hypertensive urgency: Secondary | ICD-10-CM

## 2014-12-01 DIAGNOSIS — I10 Essential (primary) hypertension: Secondary | ICD-10-CM | POA: Insufficient documentation

## 2014-12-01 DIAGNOSIS — R42 Dizziness and giddiness: Secondary | ICD-10-CM | POA: Insufficient documentation

## 2014-12-01 DIAGNOSIS — Z8739 Personal history of other diseases of the musculoskeletal system and connective tissue: Secondary | ICD-10-CM | POA: Insufficient documentation

## 2014-12-01 DIAGNOSIS — Z79899 Other long term (current) drug therapy: Secondary | ICD-10-CM | POA: Insufficient documentation

## 2014-12-01 DIAGNOSIS — Z8619 Personal history of other infectious and parasitic diseases: Secondary | ICD-10-CM | POA: Diagnosis not present

## 2014-12-01 DIAGNOSIS — E119 Type 2 diabetes mellitus without complications: Secondary | ICD-10-CM | POA: Diagnosis not present

## 2014-12-01 HISTORY — DX: Scoliosis, unspecified: M41.9

## 2014-12-01 LAB — CBC
HCT: 41.4 % (ref 36.0–46.0)
Hemoglobin: 14 g/dL (ref 12.0–15.0)
MCH: 30.5 pg (ref 26.0–34.0)
MCHC: 33.8 g/dL (ref 30.0–36.0)
MCV: 90.2 fL (ref 78.0–100.0)
Platelets: 214 10*3/uL (ref 150–400)
RBC: 4.59 MIL/uL (ref 3.87–5.11)
RDW: 13 % (ref 11.5–15.5)
WBC: 8.3 10*3/uL (ref 4.0–10.5)

## 2014-12-01 LAB — BASIC METABOLIC PANEL
ANION GAP: 6 (ref 5–15)
BUN: 12 mg/dL (ref 6–20)
CALCIUM: 9.5 mg/dL (ref 8.9–10.3)
CO2: 30 mmol/L (ref 22–32)
Chloride: 107 mmol/L (ref 101–111)
Creatinine, Ser: 0.88 mg/dL (ref 0.44–1.00)
GFR calc Af Amer: >60 mL/min (ref >60)
GFR calc non Af Amer: >60 mL/min (ref >60)
GLUCOSE: 114 mg/dL — AB (ref 65–99)
Potassium: 4 mmol/L (ref 3.5–5.1)
Sodium: 143 mmol/L (ref 135–145)

## 2014-12-01 NOTE — ED Provider Notes (Signed)
I saw and evaluated the patient, reviewed the resident's note and I agree with the findings and plan.   EKG Interpretation   Date/Time:  Thursday December 01 2014 14:05:38 EDT Ventricular Rate:  88 PR Interval:  154 QRS Duration: 86 QT Interval:  360 QTC Calculation: 435 R Axis:   -48 Text Interpretation:  Sinus rhythm Left anterior fascicular block Low  voltage, precordial leads Abnormal R-wave progression, late transition  Confirmed by Gennette Shadix  MD, Darren Caldron ([id number]) on 12/01/2014 3:31:42 PM     Patient here complaining of increased blood pressure and mild headache. No reported seizure activity. No vomiting. Patient's blood pressure was elevated at 214/120. She took her normal pressure medications and now fills better. States that she has had questionable compliance with her meds. Neurological exam is nonfocal. We'll check blood work and likely discharge  Lorre NickAnthony Saud Bail, MD 12/01/14 1541

## 2014-12-01 NOTE — ED Notes (Signed)
EMS - Patient coming from work with c/o of headache and blurred vision.  EMS stated that patient patient was outside in the heat for a company event for approximately 1 hour and had also had not been feeling good prior to today.  Patient is non-compliant with BP medications x 1 week.  Initial BP 214/120, 109 CBG, HR 85.  Patient did take 1  Losartan prior to EMS bringing her to hospital.  Headache is currently a 3/10.  Patient was nauseated, give  Zofran with EMS.

## 2014-12-01 NOTE — ED Provider Notes (Signed)
CSN: [id number]     Arrival date & time 12/01/14  1351 History   First MD Initiated Contact with Patient 12/01/14 1507     Chief Complaint  Patient presents with  . Hypertension     (Consider location/radiation/quality/duration/timing/severity/associated sxs/prior Treatment) HPI Patient is a 49 year old African-American female with a history of diabetes hypertension, seizures, depression presenting for headache blurred vision and hypertension today. 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 Attempt to sit down with no alleviation. Headaches generalized with associated photophobia. Denies previous similar events. Had not taken blood pressure medicines early in the day. EMS arrived and found systolic blood pressure to be 8:292:15. Patient took blood pressure medications and headaches again to resolve and blood pressure decreasing will en route to the emergency department.  Past Medical History  Diagnosis Date  . Diabetes mellitus   . Hyperlipidemia   . Hypertension   . Depression   . Seizures   . Thyroid disease   . Asthma   . Endometriosis     s/p LOA 2003  . GERD (gastroesophageal reflux disease)   . Colon polyps   . Hx of varicella   . Scoliosis    Past Surgical History  Procedure Laterality Date  . Ovarian cyst removal  2011  . Mastoidectomy Right 2011  . Laproscopy  [phone],2003,2011  . Breast biopsy  2008  . Abdominal hysterectomy  2003    TAH BSO endometriosis  . Mastoidectomy  2011   Family History  Problem Relation Age of Onset  . Hypertension Mother   . Breast cancer Mother [id number]    breast  . Diabetes Mother   . Prostate cancer Father     prostate  . Diabetes Father   . Hypertension Father   . Goiter Maternal Grandmother   . Diabetes Maternal Grandmother   . Diabetes Maternal Grandfather   . Diabetes Paternal Grandmother    History  Substance Use Topics  . Smoking status: Never Smoker   . Smokeless tobacco:  Never Used  . Alcohol Use: No   OB History    Gravida Para Term Preterm AB TAB SAB Ectopic Multiple Living   0              Review of Systems  Constitutional: Negative for fever and chills.  HENT: Negative for congestion and sore throat.   Eyes: Negative for pain.  Respiratory: Negative for cough and shortness of breath.   Cardiovascular: Negative for chest pain and palpitations.  Gastrointestinal: Negative for nausea, vomiting, abdominal pain and diarrhea.  Genitourinary: Negative for dysuria and flank pain.  Musculoskeletal: Negative for back pain and neck pain.  Skin: Negative for rash.  Allergic/Immunologic: Negative.   Neurological: Positive for dizziness, light-headedness and headaches. Negative for weakness.  Psychiatric/Behavioral: Negative for confusion.      Allergies  Review of patient's allergies indicates no known allergies.  Home Medications   Prior to Admission medications   Medication Sig Start Date End Date Taking? Authorizing Provider  citalopram (CELEXA) 20 MG tablet TAKE 1  Po qd  Or as diercted 05/30/14   Madelin HeadingsWanda K Panosh, MD  cyclobenzaprine (FLEXERIL) 10 MG tablet Take 10 mg by mouth 3 (three) times daily as needed for muscle spasms.    Historical Provider, MD  desonide (DESOWEN) 0.05 % cream Apply topically daily. Apply to face and breast    Historical Provider, MD  fish oil-omega-3 fatty acids 1000 MG capsule Take 2 g by  mouth daily.    Historical Provider, MD  glucose blood (ONE TOUCH TEST STRIPS) test strip Test daily. 05/30/14   Madelin Headings, MD  losartan (COZAAR) 100 MG tablet Take 1 tablet (100 mg total) by mouth daily. 08/29/14   Madelin Headings, MD  metFORMIN (GLUCOPHAGE-XR) 500 MG 24 hr tablet Take 2 tablets (1,000 mg total) by mouth daily with breakfast. 08/29/14   Madelin Headings, MD  [facility] DELICA LANCETS FINE MISC Test once daily 05/30/14   Madelin Headings, MD  polyethylene glycol powder (GLYCOLAX/MIRALAX) powder Take 17 g by mouth daily. 07/29/12    Carmelina Dane, MD  rosuvastatin (CRESTOR) 10 MG tablet TAKE 1 TABLET (10 MG TOTAL) BY MOUTH DAILY. 05/30/14   Madelin Headings, MD   BP 139/98 mmHg  Pulse 79  Temp(Src) 98.1 F (36.7 C) (Oral)  Resp 15  Ht  (1.626 m)  Wt 180 lb (81.647 kg)  BMI 30.88 kg/m2  SpO2 97% Physical Exam  Constitutional: She is oriented to person, place, and time. She appears well-developed and well-nourished. No distress.  HENT:  Head: Normocephalic and atraumatic.  Eyes: Conjunctivae and EOM are normal. Pupils are equal, round, and reactive to light.  Neck: Normal range of motion. Neck supple.  Cardiovascular: Normal rate, regular rhythm and normal heart sounds.   Pulmonary/Chest: Effort normal and breath sounds normal. No respiratory distress.  Abdominal: Soft. Bowel sounds are normal. There is no tenderness.  Musculoskeletal: Normal range of motion.  Neurological: She is alert and oriented to person, place, and time. She has normal strength and normal reflexes. No cranial nerve deficit or sensory deficit. She displays a negative Romberg sign. GCS eye subscore is 4. GCS verbal subscore is 5. GCS motor subscore is 6.  Normal finger to nose bilaterally.  Rapid alternating movements intact bilaterally.  Normal heal to shin bilaterally.   No pronator drift bilaterally.    Skin: Skin is warm and dry. She is not diaphoretic.  Psychiatric: She has a normal mood and affect.    ED Course  Procedures (including critical care time) Labs Review Labs Reviewed - No data to display  Imaging Review No results found.   EKG Interpretation None      MDM   Final diagnoses:  None   On arrival to the emergency department patient hemodynamically stable and in no acute distress. She reports her headache is been decreasing over her stay. Systolic blood pressure down to 157 which she reports is her normal. Denies any continued dizziness. Neurologic exam with no focal deficits at this time. Do not feel  head CT warranted. CBC and BMP with no acute abnormalities.  Pt with no CP and no acute ischemic changes on EKG.  Doubt cardiac etiology.  Advised pt to continue taking her home medications as prescribed and gave red flag return precautions.  If performed, labs, EKGs, and imaging were reviewed/interpreted by myself and my attending and incorporated into medical decision making.  Discussed pertinent finding with patient or caregiver prior to discharge with no further questions.  Immediate return precautions given and pt or caregiver reports understanding.  Pt care supervised by my attending Dr. Wyonia Hough, MD PGY-2  Emergency Medicine     Tery Sanfilippo, MD 12/02/14 [phone]

## 2014-12-01 NOTE — Discharge Instructions (Signed)

## 2014-12-05 ENCOUNTER — Telehealth: Payer: Self-pay | Admitting: Internal Medicine

## 2014-12-05 NOTE — Telephone Encounter (Signed)
Pt need a er fup. Where can I schedule

## 2014-12-05 NOTE — Telephone Encounter (Signed)
I reviewed record.  She had visual sx that the ed didn't think related to BP.  Please refer to opthalmology (or expedite to her own if she has one/  becazue of her hx of vision sx .Marland Kitchen.Blurriness  See ed note.  Have her Take blood pressure readings twice a day for 7- 10 days and then  appt and bring in her monitor.  To the visit. 30 minutes .  Also please verify that she has seen or has appt with endocrinology regarding her diabetes.  Can make appt 4 pm  (30 min ) July 26 Tuesday.

## 2014-12-06 NOTE — Telephone Encounter (Signed)
Spoke to the pt.  She has an opthalmology appointment on 12/30/14.  She has lab work set up with endo on 01/26/15 and the appt with the physician on 01/28/15.  Advised the pt to check her bp twice daily and bring the readings and cuff with her on 12/13/14 @ 4 PM when she comes for ED Follow Up.

## 2014-12-08 ENCOUNTER — Other Ambulatory Visit: Payer: Self-pay | Admitting: Internal Medicine

## 2014-12-08 NOTE — Telephone Encounter (Signed)
Sent to the pharmacy by e-scribe.  Pt has upcoming appt on 01/10/15

## 2014-12-13 ENCOUNTER — Ambulatory Visit (INDEPENDENT_AMBULATORY_CARE_PROVIDER_SITE_OTHER): Payer: 59 | Admitting: Internal Medicine

## 2014-12-13 ENCOUNTER — Encounter: Payer: Self-pay | Admitting: Internal Medicine

## 2014-12-13 VITALS — BP 149/98 | Temp 98.1°F | Wt 181.9 lb

## 2014-12-13 DIAGNOSIS — G4733 Obstructive sleep apnea (adult) (pediatric): Secondary | ICD-10-CM | POA: Diagnosis not present

## 2014-12-13 DIAGNOSIS — Z9989 Dependence on other enabling machines and devices: Secondary | ICD-10-CM

## 2014-12-13 DIAGNOSIS — I10 Essential (primary) hypertension: Secondary | ICD-10-CM | POA: Diagnosis not present

## 2014-12-13 DIAGNOSIS — E119 Type 2 diabetes mellitus without complications: Secondary | ICD-10-CM | POA: Diagnosis not present

## 2014-12-13 MED ORDER — LOSARTAN POTASSIUM-HCTZ 100-25 MG PO TABS: 1.0000 | ORAL_TABLET | Freq: Every day | ORAL | Status: DC

## 2014-12-13 NOTE — Patient Instructions (Signed)
   Blood pressure monitoring bring her monitor to a clinical visit to check against an office monitor so we know it is accurate out of the office.  We need to intensify her blood pressure control medication.  Stop the losartan plain change to losartan HCTZ one a day.  Keep appointment with endocrinology.  Make appointment with sleep specialist. Is very important that sleep apnea is treated correctly to avoid headaches hypertension is hard to treat fatigue heart arrhythmias and other serious consequences.  Please let us know if we need to help in this way.  Avoid dehydration and getting overheated.  Keep follow-up visit in August as this is a good follow-up for your blood pressure management.  Contact us in the meantime if your blood pressure remains over 160 after 2 weeks of treatment.

## 2014-12-13 NOTE — Progress Notes (Signed)
Pre visit review using our clinic review tool, if applicable. No additional management support is needed unless otherwise documented below in the visit note.  Chief Complaint  Patient presents with  . ER Follow Up    bp and feeling bad        HPI: Patient come in for follow up from ED visit.. From 7 14 2014 had event  To help with  Outside  and didn't feel well that day  Was a hot day.   Heat could be related  .  Some nausea.  Some ha  And then felt badly  enough   That  bp taken and was about 215 range  Ems called and taken to hospital where  Reading came down over time   But no med change   Observed felt safe to go home . Had no lov but some blurriness  Hard to focus  No neuro sx otherwise  ? Unknown bg . But was 109 range in ed .   she brings in readings from home as directed ave 149/101 peak 164/105 lowest 134/103  Taking losartan 100  Each day  ROS: See pertinent positives and negatives per HPI. No cp  Gest sob when goes out in bad air  But not at rest  Stress at work sometimes 14 hours days    Past Medical History  Diagnosis Date  . Diabetes mellitus   . Hyperlipidemia   . Hypertension   . Depression   . Seizures   . Thyroid disease   . Asthma   . Endometriosis     s/p LOA 2003  . GERD (gastroesophageal reflux disease)   . Colon polyps   . Hx of varicella   . Scoliosis     Family History  Problem Relation Age of Onset  . Hypertension Mother   . Breast cancer Mother 65    breast  . Diabetes Mother   . Prostate cancer Father     prostate  . Diabetes Father   . Hypertension Father   . Goiter Maternal Grandmother   . Diabetes Maternal Grandmother   . Diabetes Maternal Grandfather   . Diabetes Paternal Grandmother     History   Social History  . Marital Status: Single    Spouse Name: N/A  . Number of Children: N/A  . Years of Education: N/A   Social History Main Topics  . Smoking status: Never Smoker   . Smokeless tobacco: Never Used  . Alcohol Use: No   . Drug Use: No  . Sexual Activity: No   Other Topics Concern  . 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 citalopram (CELEXA) 20 MG tablet TAKE 1 TABLET BY MOUTH EVERY DAY OR AS DIRECTED 90 tablet 0  . cyclobenzaprine (FLEXERIL) 10 MG tablet Take 10 mg by mouth 3 (three) times daily as needed for muscle spasms.    . fish oil-omega-3 fatty acids 1000 MG capsule Take 2 g by mouth daily.    Marland Kitchen glucose blood (ONE TOUCH TEST STRIPS) test strip Test daily. 100 each 12  .  losartan (COZAAR) 100 MG tablet Take 1 tablet (100 mg total) by mouth daily. 90 tablet 3  . metFORMIN (GLUCOPHAGE-XR) 500 MG 24 hr tablet Take 2 tablets (1,000 mg total) by mouth daily with breakfast. 180 tablet 1  . ONETOUCH DELICA LANCETS FINE MISC Test once daily 100 each 5  . polyethylene glycol powder (GLYCOLAX/MIRALAX) powder Take 17 g by mouth daily. 3350 g 1  . rosuvastatin (CRESTOR) 10 MG tablet TAKE 1 TABLET (10 MG TOTAL) BY MOUTH DAILY. 30 tablet 12  . desonide (DESOWEN) 0.05 % cream Apply topically daily. Apply to face and breast     No facility-administered medications prior to visit.     EXAM:  BP 149/98 mmHg  Temp(Src) 98.1 F (36.7 C) (Oral)  Wt 181 lb 14.4 oz (82.509 kg)  Body mass index is 31.21 kg/(m^2). bp repeat  149/98 GENERAL: vitals reviewed and listed above, alert, oriented, appears well hydrated and in no acute distress HEENT: atraumatic, conjunctiva  clear, no obvious abnormalities on inspection of external nose and ears NECK: no obvious masses on inspection palpation  LUNGS: clear to auscultation bilaterally, no wheezes, rales or rhonchi, good air movement CV: HRRR, no clubbing cyanosis or  peripheral edema  nl cap refill  MS: moves all extremities without noticeable focal  abnormality PSYCH: pleasant and cooperative, no obvious depression or anxiety BP Readings from Last 3 Encounters:  12/13/14 149/98  12/01/14 147/101  08/29/14 148/92   Wt Readings from Last 3 Encounters:  12/13/14 181 lb 14.4 oz (82.509 kg)  12/01/14 180 lb (81.647 kg)  08/29/14 177 lb 12.8 oz (80.65 kg)   Lab Results  Component Value Date   WBC 8.3 12/01/2014   HGB 14.0 12/01/2014   HCT 41.4 12/01/2014   PLT 214 12/01/2014   GLUCOSE 114* 12/01/2014   CHOL 185 08/29/2014   TRIG 70.0 08/29/2014   HDL 49.50 08/29/2014   LDLCALC 122* 08/29/2014   ALT 30 05/30/2014   AST 19 05/30/2014   NA 143 12/01/2014   K 4.0 12/01/2014   CL 107 12/01/2014   CREATININE 0.88 12/01/2014   BUN 12 12/01/2014   CO2 30 12/01/2014   TSH 0.91 05/30/2014   HGBA1C 8.2* 08/29/2014   MICROALBUR 0.92 10/21/2012   Wt Readings from Last 3 Encounters:  12/13/14 181 lb 14.4 oz (82.509 kg)  12/01/14 180 lb (81.647 kg)  08/29/14 177 lb 12.8 oz (80.65 kg)   BP Readings from Last 3 Encounters:  12/13/14 149/98  12/01/14 147/101  08/29/14 148/92    ASSESSMENT AND PLAN:  Discussed the following assessment and plan:  Essential hypertension - not currently controlled with severe spike sending ot ed  ? reactive  with hgi baseline vs other  event could also be related to heat   Diabetes mellitus without complication - bg had been ok no lows   has rov with endo in september   OSA on CPAP - machine not working for the last few months  could be adding to  bp elevation   get back with initial rxed ? neuro  to get aduate rx  Discussed options of intensification calcium channel blocker versus diuretic changed to losartan HCTZ caution with initial orthostatic dizziness contact us of blood pressure readings still high in 2 weeks otherwise keep her appointment at the end of August for follow-up. She can get her blood pressure monitor check when she  goes to any clinical provider. Suspect correlates. Also emphasized the importance of sleep apnea being treated  correctly regard to cardiovascular health and blood pressure control. Patient is aware. -Patient advised to return or notify health care team  if symptoms worsen ,persist or new concerns arise.  Patient Instructions   Blood pressure monitoring bring her monitor to a clinical visit to check against an office monitor so we know it is accurate out of the office.  We need to intensify her blood pressure control medication.  Stop the losartan plain change to losartan HCTZ one a day.  Keep appointment with endocrinology.  Make appointment with sleep specialist. Is very important that sleep apnea is treated correctly to avoid headaches hypertension is hard to treat fatigue heart arrhythmias and other serious consequences.  Please let us know if we need to help in this way.  Avoid dehydration and getting overheated.  Keep follow-up visit in August as this is a good follow-up for your blood pressure management.  Contact us in the meantime if your blood pressure remains over 160 after 2 weeks of treatment.      Neta Mends. Tanisha Lutes M.D.

## 2015-01-08 ENCOUNTER — Other Ambulatory Visit: Payer: Self-pay | Admitting: Internal Medicine

## 2015-01-10 ENCOUNTER — Encounter: Payer: Self-pay | Admitting: Internal Medicine

## 2015-01-10 ENCOUNTER — Ambulatory Visit (INDEPENDENT_AMBULATORY_CARE_PROVIDER_SITE_OTHER): Payer: 59 | Admitting: Internal Medicine

## 2015-01-10 DIAGNOSIS — I10 Essential (primary) hypertension: Secondary | ICD-10-CM | POA: Diagnosis not present

## 2015-01-10 MED ORDER — POLYETHYLENE GLYCOL 3350 17 GM/SCOOP PO POWD: 17.0000 g | Freq: Every day | ORAL | Status: DC

## 2015-01-10 NOTE — Progress Notes (Signed)
Pre visit review using our clinic review tool, if applicable. No additional management support is needed unless otherwise documented below in the visit note.  Chief Complaint  Patient presents with  . Follow-up    HPI: Karen Foley 49 y.o.  comes in for chronic disease/ medication management  Ht readings at home   And brought i n machine and readings  to review.   Taking losartan hctz 100/25 no se of med   bp s 134/100-- now  126, 137, 144 83 102 last readings   Has been on new  Med for 3 weeks   Dm  Apt with endo in September sugars are good   osa hasn't  Gotten with neuro yet  Plans onit over looked this .   needs refill miralax  ROS: See pertinent positives and negatives per HPI.  Past Medical History  Diagnosis Date  . Diabetes mellitus   . Hyperlipidemia   . Hypertension   . Depression   . Seizures   . Thyroid disease   . Asthma   . Endometriosis     s/p LOA 2003  . GERD (gastroesophageal reflux disease)   . Colon polyps   . Hx of varicella   . Scoliosis     Family History  Problem Relation Age of Onset  . Hypertension Mother   . Breast cancer Mother 15    breast  . Diabetes Mother   . Prostate cancer Father     prostate  . Diabetes Father   . Hypertension Father   . Goiter Maternal Grandmother   . Diabetes Maternal Grandmother   . Diabetes Maternal Grandfather   . Diabetes Paternal Grandmother     Social History   Social History  . Marital Status: Single    Spouse Name: N/A  . Number of Children: N/A  . Years of Education: N/A   Social History Main Topics  . Smoking status: Never Smoker   . Smokeless tobacco: Never Used  . Alcohol Use: No  . Drug Use: No  . Sexual Activity: No   Other Topics Concern  . 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 citalopram (CELEXA) 20 MG tablet TAKE 1 TABLET BY MOUTH EVERY DAY OR AS DIRECTED 90 tablet 0  . cyclobenzaprine (FLEXERIL) 10 MG tablet Take 10 mg by mouth 3 (three) times daily as needed for muscle spasms.    Marland Kitchen desonide (DESOWEN) 0.05 % cream Apply topically daily. Apply to face and breast    . fish oil-omega-3 fatty acids 1000 MG capsule Take 2 g by mouth daily.    Marland Kitchen glucose blood (ONE TOUCH TEST STRIPS) test strip Test daily. 100 each 12  . losartan-hydrochlorothiazide (HYZAAR) 100-25 MG per tablet Take 1 tablet by mouth daily. 90 tablet 3  . metFORMIN (GLUCOPHAGE-XR) 500 MG 24 hr tablet Take 2 tablets (1,000 mg total) by mouth daily with breakfast. 180 tablet 1  . ONETOUCH DELICA LANCETS FINE MISC Test once daily 100 each 5  . rosuvastatin (CRESTOR) 10 MG tablet TAKE 1 TABLET (10 MG TOTAL) BY MOUTH DAILY.  30 tablet 12  . polyethylene glycol powder (GLYCOLAX/MIRALAX) powder Take 17 g by mouth daily. 3350 g 1  . losartan (COZAAR) 100 MG tablet Take 1 tablet (100 mg total) by mouth daily. 90 tablet 3   No facility-administered medications prior to visit.     EXAM:  BP 138/84 mmHg  Temp(Src) 98.1 F (36.7 C) (Oral)  Wt 180 lb (81.647 kg)  Body mass index is 30.88 kg/(m^2). bp readings  Retaken and see above  138 and 127  Office large and pat machine  GENERAL: vitals reviewed and listed above, alert, oriented, appears well hydrated and in no acute distress HEENT: atraumatic, conjunctiva  clear, no obvious abnormalities on inspection of external nose and ears MS: moves all extremities without noticeable focal  abnormality PSYCH: pleasant and cooperative, no obvious depression or anxiety BP Readings from Last 3 Encounters:  01/10/15 138/84  12/13/14 149/98  12/01/14 147/101   Wt Readings from Last 3 Encounters:  01/10/15 180 lb (81.647 kg)  12/13/14 181 lb 14.4 oz (82.509 kg)  12/01/14  180 lb (81.647 kg)     ASSESSMENT AND PLAN:  Discussed the following assessment and plan:  Essential hypertension - Plan: Basic metabolic panel Improving  But only on new reg 3 weeks continue  And send in readings  3month or so   May need to add ccb or adjsut me but  may be fine if osa alos adressed  -Patient advised to return or notify health care team  if symptoms worsen ,persist or new concerns arise.  Patient Instructions  Bp  monitor is accurate    Enough to make decisions .Marland Kitchen    Please    Get back with  Neuro about the sleep apnea .   Will notify you  of labs when available.  Sen in readings   After another month of medicatgion to see if controlled   consider adding a CCB  If needed to control BP  But may come down in another month     [name] M.D.

## 2015-01-10 NOTE — Patient Instructions (Addendum)
Bp  monitor is accurate    Enough to make decisions .Marland Kitchen    Please    Get back with  Neuro about the sleep apnea .   Will notify you  of labs when available.  Sen in readings   After another month of medicatgion to see if controlled   consider adding a CCB  If needed to control BP  But may come down in another month

## 2015-01-10 NOTE — Telephone Encounter (Signed)
Filled on 12/08/14 for ninety days.  Request is too soon.

## 2015-01-11 LAB — BASIC METABOLIC PANEL
BUN: 20 mg/dL (ref 6–23)
CHLORIDE: 100 meq/L (ref 96–112)
CO2: 30 mEq/L (ref 19–32)
Calcium: 9.9 mg/dL (ref 8.4–10.5)
Creatinine, Ser: 0.97 mg/dL (ref 0.40–1.20)
GFR: 78.25 mL/min (ref >60.00)
Glucose, Bld: 90 mg/dL (ref 70–99)
Potassium: 4 mEq/L (ref 3.5–5.1)
SODIUM: 140 meq/L (ref 135–145)

## 2015-01-16 ENCOUNTER — Other Ambulatory Visit: Payer: Self-pay | Admitting: Internal Medicine

## 2015-01-18 NOTE — Telephone Encounter (Signed)
Ok to refill x 1 year 

## 2015-01-19 NOTE — Telephone Encounter (Signed)
Sent to the pharmacy by e-scribe. 

## 2015-01-25 ENCOUNTER — Ambulatory Visit (INDEPENDENT_AMBULATORY_CARE_PROVIDER_SITE_OTHER): Payer: 59 | Admitting: Neurology

## 2015-01-25 ENCOUNTER — Encounter: Payer: Self-pay | Admitting: Neurology

## 2015-01-25 VITALS — BP 167/105 | HR 99 | Resp 14 | Ht 63.5 in | Wt 182.0 lb

## 2015-01-25 DIAGNOSIS — G4710 Hypersomnia, unspecified: Secondary | ICD-10-CM

## 2015-01-25 DIAGNOSIS — G4733 Obstructive sleep apnea (adult) (pediatric): Secondary | ICD-10-CM

## 2015-01-25 DIAGNOSIS — Z9114 Patient's other noncompliance with medication regimen: Secondary | ICD-10-CM | POA: Diagnosis not present

## 2015-01-25 DIAGNOSIS — I10 Essential (primary) hypertension: Secondary | ICD-10-CM | POA: Diagnosis not present

## 2015-01-25 NOTE — Progress Notes (Signed)
S[facility]EP [facility]INE CL[facility]IC   Provider:  Melvyn Foley, M D  Referring Provider: Madelin Headings, MD Primary Care Physician:  Karen Harp, MD  Chief Complaint  Patient presents with  . Follow-up   Chief complaint according to patient : "malignant hypertension, want to make sure OSA is not involved / causing this " I have stopped using CPAP .  HPI:  Karen Foley is a 49 y.o. female , seen here in a prolonged revisit  from Karen Foley for her CPAP compliance, last seen in 2013. In the meantime she developed scoliosis.   Karen Foley underwent a sleep study on 03-14-12 at [facility] . At the time she had been diagnosed with mild obstructive sleep apnea but enough to return for CPAP titration. Her AHI was 16.7 her RDI was 18.8 and the lowest oxygen saturation was 82% with only 6.4 minutes of desaturations.  REM AHI was 58 high, and supine AHI was 22 ; she spent a total of 75% of her sleep time in the supine sleep position. There was no indication that she would do better with a dental device.  The patient was prescribed a CPAP at only 6 cm water pressure. Because she appeared still excessively daytime sleepy while later she was reevaluated in 2014,  by nurse practitioner Karen Foley.  Soon after she had the feeling that she developed irritation in the throat and as she stopped using the CPAP  the irritation ceased. She has no data to be downloaded today as she hasn't used the machine in a while ( 2 years ) her machine is a Hotel manager -an Art therapist.   Over the last 2 years the patient has lost weight, purposefully. She is taking metformin but her diabetes is considered mild well controlled. I will go to a day is to see if her sleep apnea is reduced to a degree where  CPAP is unnecessary.   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 Sometimes she will not go home before 10 PM and  usually just falls into bed asleep. The usual workday or average workday begins at 8 AM and is supposed to and at 5 which it rarely does. As no time in daytime for naps.  Her bedtime is after 10 PM . Friends have witnessed her to snore or labor early brief. After her recent spell of malignant hypertension she had a friend stay with her also confirmed that she snores. She prefers to sleep on her back which may further accentuate her apnea. Positional therapy can be continued. She likes to rest on 2 or 3 pillows which pushes the supine position . Her bedroom is described as cool, quiet and dark. He used she usually sleeps alone.  She rises in the morning at 6 AM but she is often spontaneously already awake, and does not rely on an alarm. She uses the alarm for backup. She will drink 1 or 2 cups of coffee in the morning, takes a small breakfast before she commutes. Her office facility does not have daylight at least not at her workplace. She is not a shift Financial controller.  Sleep medical history and family sleep history: Mother , father snored.  Social history:  Single, full time gainfully employed, caffeine 1-2 cups, no tea , no sodas. No tobacco or alcohol use.  Review of Systems: Out of a complete 14 system review, the patient  complains of only the following symptoms, and all other reviewed systems are negative. Malignant hypertension with episodic blood pressure rise to 200 systolic or above. She endorsed today the Epworth sleepiness score at 15 points and the fatigue severity score at  Epworth score 15 , Fatigue severity score 39  , depression score 2    Social History   Social History  . Marital Status: Single    Spouse Name: N/A  . Number of Children: N/A  . Years of Education: N/A   Occupational History  . Not on file.   Social History Main Topics  . Smoking status: Never Smoker   . Smokeless tobacco: Never Used  . Alcohol Use: No  . Drug Use: No  . Sexual Activity: No   Other Topics  Concern  . 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 Hypertension Mother   . Breast cancer Mother 48    breast  . Diabetes Mother   . Prostate cancer Father     prostate  . Diabetes Father   . Hypertension Father   . Goiter Maternal Grandmother   . Diabetes Maternal Grandmother   . Diabetes Maternal Grandfather   . Diabetes Paternal Grandmother     Past Medical History  Diagnosis Date  . Diabetes mellitus   . Hyperlipidemia   . Hypertension   . Depression   . Seizures   . Thyroid disease   . Asthma   . Endometriosis     s/p LOA 2003  . GERD (gastroesophageal reflux disease)   . Colon polyps   . Hx of varicella   . Scoliosis     Past Surgical History  Procedure Laterality Date  . Ovarian cyst removal  2011  . Mastoidectomy Right 2011  . Laproscopy  [phone]  . Breast biopsy  2008  . Abdominal hysterectomy  2003    TAH BSO endometriosis  . Mastoidectomy  2011    Current Outpatient Prescriptions  Medication Sig Dispense Refill  . cephALEXin (KEFLEX) 500 MG capsule Take 1 capsule by mouth 3 (three) times daily.  0  . citalopram (CELEXA) 20 MG tablet TAKE 1 TABLET BY MOUTH EVERY DAY OR AS DIRECTED 90 tablet 3  . clindamycin (CLEOCIN) 300 MG capsule Take 1 capsule by mouth every 6 (six) hours.  0  . cyclobenzaprine (FLEXERIL) 10 MG tablet Take 10 mg by mouth 3 (three) times daily as needed for muscle spasms.    Marland Kitchen desonide (DESOWEN) 0.05 % cream Apply topically daily. Apply to face and breast    . fish oil-omega-3 fatty acids 1000 MG capsule Take 2 g by mouth daily.    Marland Kitchen glucose blood (ONE TOUCH TEST STRIPS) test strip Test daily. 100 each 12  . losartan-hydrochlorothiazide (HYZAAR) 100-25 MG  per tablet Take 1 tablet by mouth daily. 90 tablet 3  . metFORMIN (GLUCOPHAGE-XR) 500 MG 24 hr tablet Take 2 tablets (1,000 mg total) by mouth daily with breakfast. 180 tablet 1  . ONETOUCH DELICA LANCETS FINE MISC Test once daily 100 each 5  . polyethylene glycol powder (GLYCOLAX/MIRALAX) powder Take  17 g by mouth daily. 3350 g 1  . rosuvastatin (CRESTOR) 10 MG tablet TAKE 1 TABLET (10 MG TOTAL) BY MOUTH DAILY. 30 tablet 12   No current facility-administered medications for this visit.    Allergies as of 01/25/2015  . (No Known Allergies)    Vitals: BP 167/105 mmHg  Pulse 99  Resp 20  Ht 5' 3.5" (1.613 m)  Wt 182 lb (82.555 kg)  BMI 31.73 kg/m2 Last Weight:  Wt Readings from Last 1 Encounters:  01/25/15 182 lb (82.555 kg)   ZOX:WRUE mass index is 31.73 kg/(m^2).     Last Height:   Ht Readings from Last 1 Encounters:  01/25/15 5' 3.5" (1.613 m)    Physical exam:  General: The patient is awake, alert and appears not in acute distress. The patient is well groomed. Head: Normocephalic, atraumatic. Neck is supple. Mallampati 3,  neck circumference:14.5 . Nasal airflow unrestricited , TMJ  click not evident . Retrognathia is seen.  Cardiovascular:  Regular rate and rhythm , without  murmurs or carotid bruit, and without distended neck veins. Respiratory: Lungs are clear to auscultation. Skin:  Without evidence of edema, or rash Trunk:  The patient's posture is erect   Neurologic exam : The patient is awake and alert, oriented to place and time.   Memory subjective described as impaired, attributed to fatigue. .   Attention span & concentration ability appears normal.  Speech is fluent, without dysarthria, dysphonia or aphasia.  Mood and affect are appropriate.  Cranial nerves:  No change in her sense of smell or taste. Pupils are equal and briskly reactive to light.  Extraocular movements  in vertical and horizontal planes intact and without nystagmus. Visual fields by  finger perimetry are intact. Hearing to finger rub intact.   Facial sensation intact to fine touch.  Facial motor strength is symmetric and tongue and uvula move midline. Shoulder shrug was symmetrical.   Motor exam:   Normal tone, muscle bulk and symmetric strength in all extremities. Sensory:  Fine touch, pinprick and vibration were tested in all extremities.  Proprioception tested in the upper extremities was normal. Coordination: Rapid alternating movements in the fingers/hands was normal. Finger-to-nose maneuver  normal without evidence of ataxia, dysmetria or tremor. No change in handwriting .No trouble to button or to type.  Gait and station: Patient walks without assistive device and is able unassisted to climb up to the exam table. Strength within normal limits.  Stance is stable and normal.  Toe and heel stand were tested . Tandem gait is unfragmented. Turns with 3 steps. Romberg testing is negative.  Deep tendon reflexes: in the  upper and lower extremities are symmetric and intact. Babinski maneuver response is downgoing.  The patient was advised of the nature of the diagnosed sleep disorder , the treatment options and risks for general a health and wellness arising from not treating the condition.  I spent more than 40 minutes of face to face time with the patient. Greater than 50% of time was spent in counseling and coordination of care. We have discussed the diagnosis and differential and I answered the patient's questions.     Assessment:  After physical and neurologic examination, review of laboratory studies,  Personal review of imaging studies, reports of other /same  Imaging studies, results of polysomnography/ neurophysiology testing and pre-existing records as far as provided in visit. My assessment is   1) CPAP non compliance-  still snoring with remaining hypersomnia.  Re evaluate  for CPAP need and correlate to hypertension . Will order a PSG split. If AHI less than 15 ,  may need to consider alternatives to CPAP.  She is aware of dental device options .  2)fatigue , excessively high. No history of arthritis , but endometriosis and scoliosis.  Check for Vit D and B 12 deficiency and iron levels.  Lack of exposure to day light.   3) subjective memory difficulties word finding difficulties and at times problems with spelling. 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 MMSE 30-30. Next visit will follow with a MOCA.     Plan:  Treatment plan and additional workup : Rv after repeat sleep study.      Karen Mylar Patty Lopezgarcia MD  01/25/2015   CC: Karen Headings, Md [address] [place], [place] [address]

## 2015-02-19 IMAGING — CT CT ABD-PELV W/ CM
3 of 5 series · 13 of 36 positions shown, 19 images · IV contrast (READICAT/WATER & [ID] OMNI 300)
Comparison: None.

CLINICAL DATA: Left lower quadrant pain and cramping since April 2012.

CT ABDOMEN AND PELVIS WITH CONTRAST
TECHNIQUE: Multidetector CT imaging of the abdomen and pelvis was
performed following the standard protocol during bolus
administration of intravenous contrast.
Contrast: 100mL OMNIPAQUE IOHEXOL 300 MG/ML  SOLN

[Series 3: abd/pelvis with · axial · 0.74mm/px · z∈[-298,+17]mm · 7 of 85 slices shown, 12 images]
[im 11/85  soft-tissue]
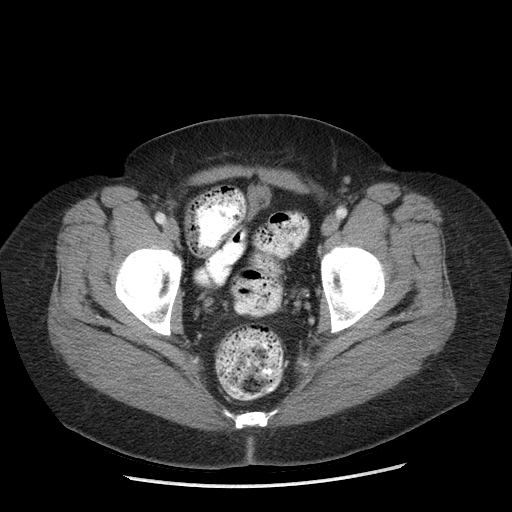
[im 11/85  bone]
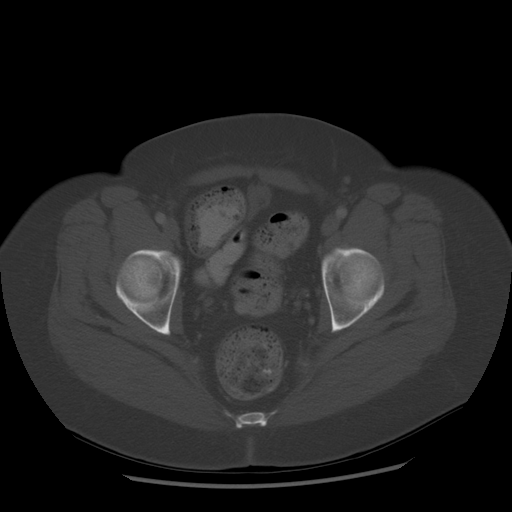
[im 22/85  soft-tissue]
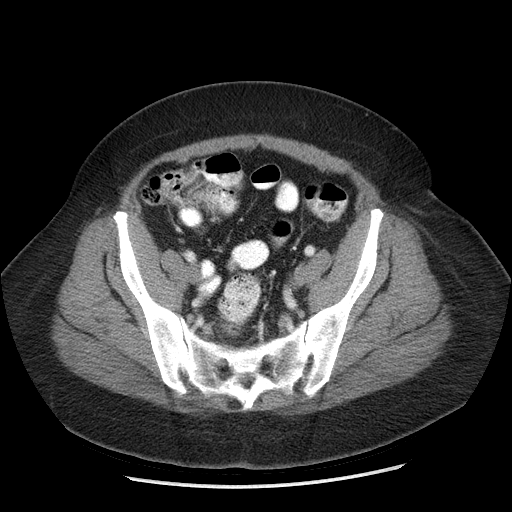
[im 32/85  soft-tissue]
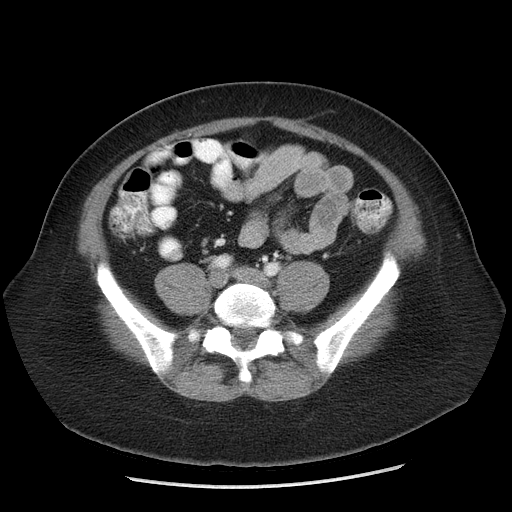
[im 43/85  soft-tissue]
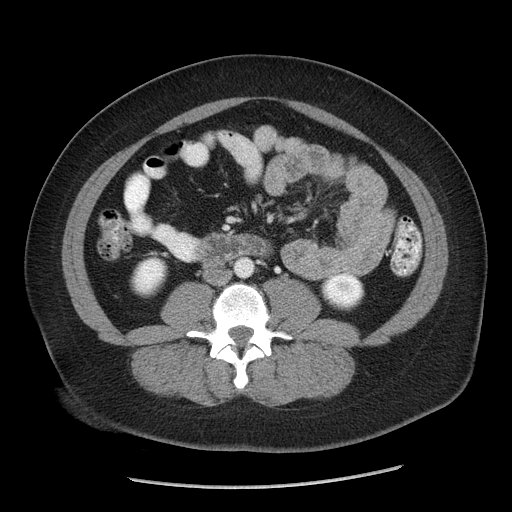
[im 43/85  lung]
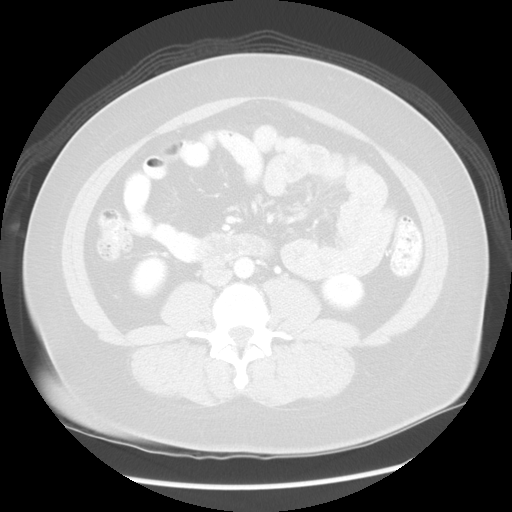
[im 53/85  soft-tissue]
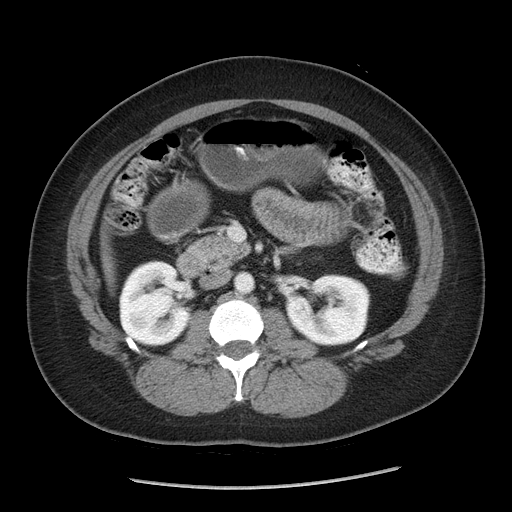
[im 53/85  lung]
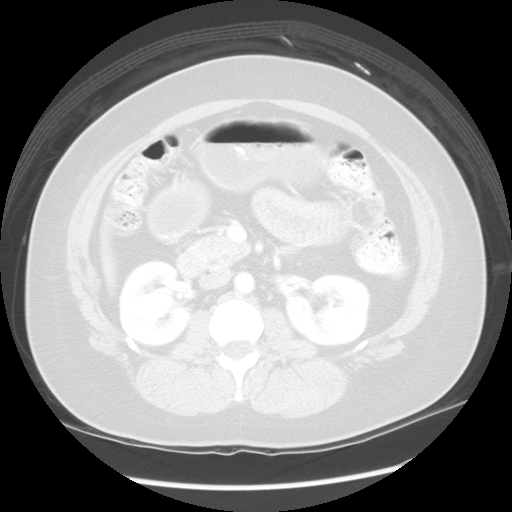
[im 64/85  soft-tissue]
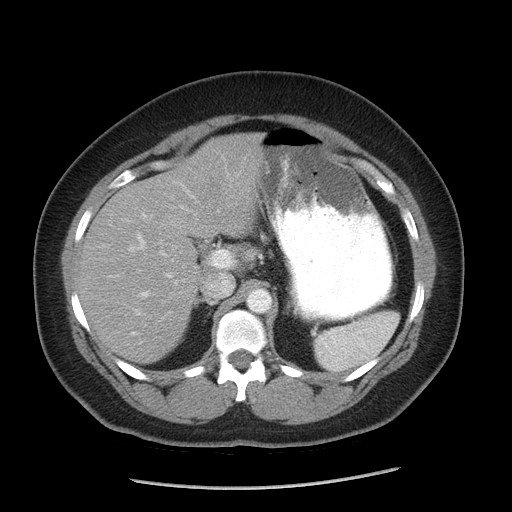
[im 64/85  lung]
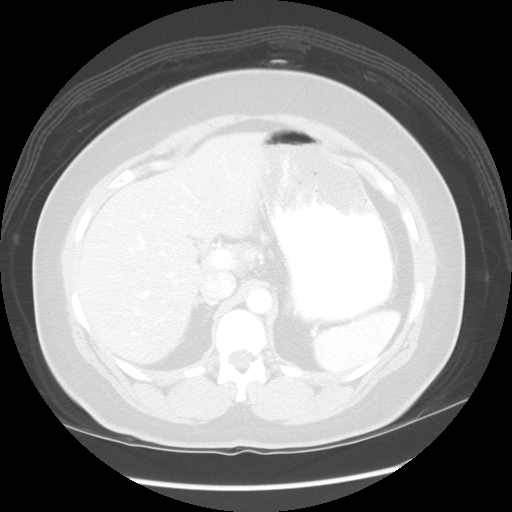
[im 74/85  soft-tissue]
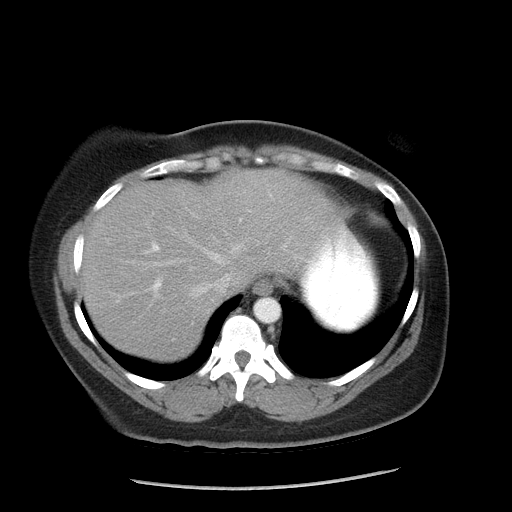
[im 74/85  lung]
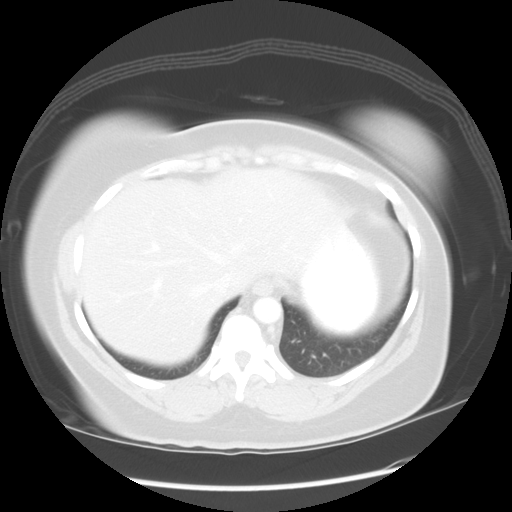

[Series 601: coronal body · coronal · 0.91mm/px · 1 of 127 slices shown, 2 images]
[im 43/127  soft-tissue]
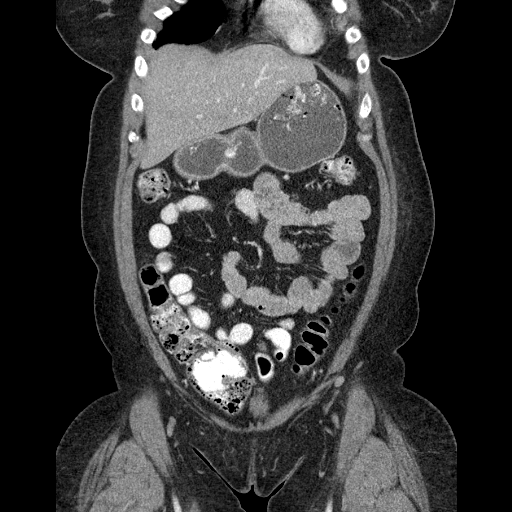
[im 43/127  bone]
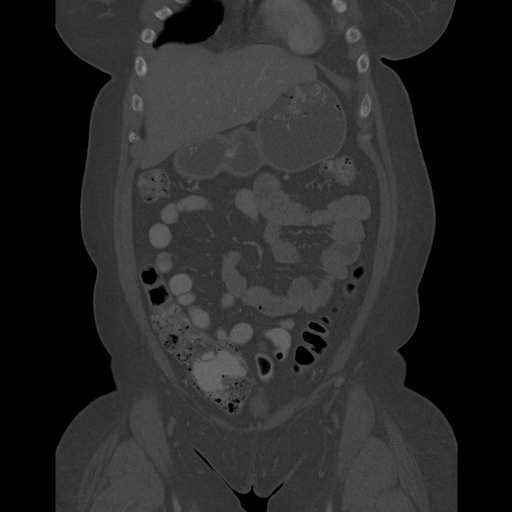

[Series 602: sagittal body · sagittal · 0.91mm/px · 5 of 153 slices shown]
[im 10/153  soft-tissue]
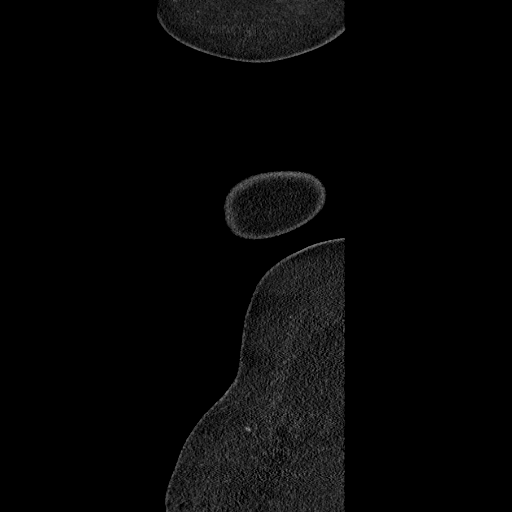
[im 29/153  soft-tissue]
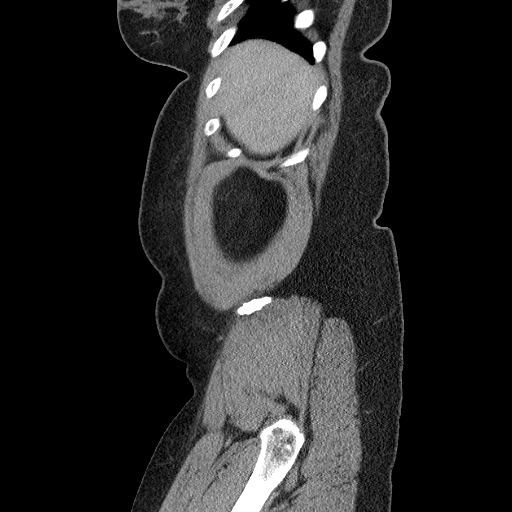
[im 48/153  soft-tissue]
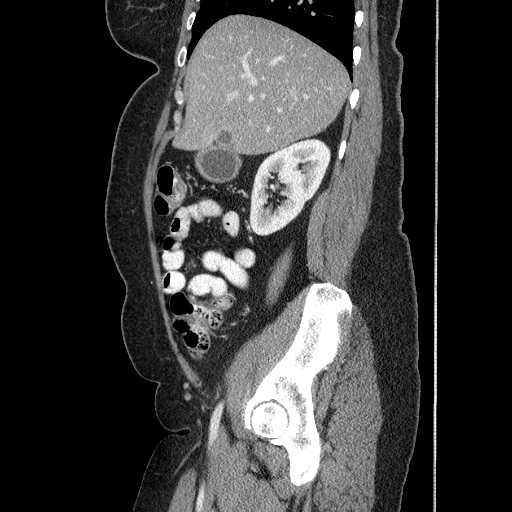
[im 67/153  soft-tissue]
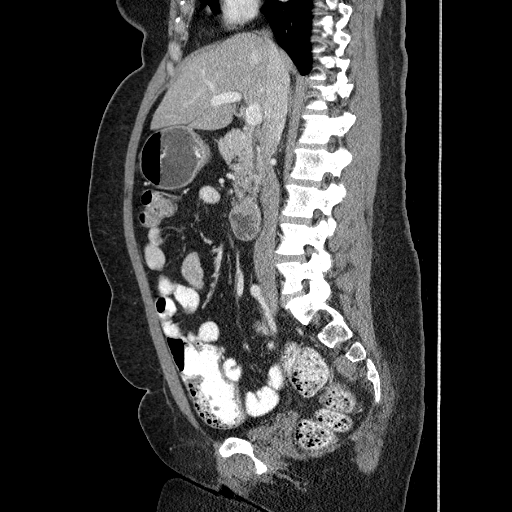
[im 86/153  soft-tissue]
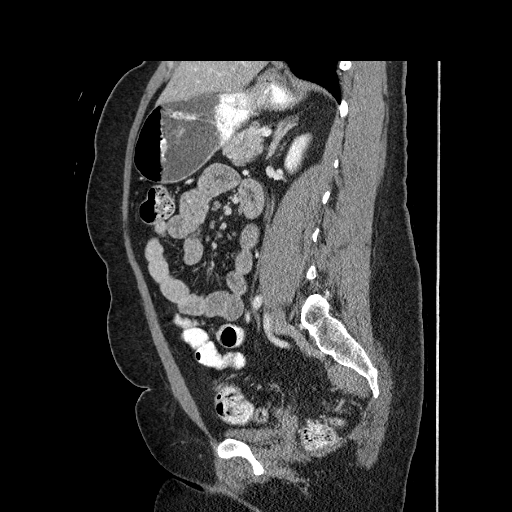

[13 of 36 positions shown; findings below may reference images not displayed]

FINDINGS: Lung bases are clear.  No pericardial fluid.

No focal hepatic lesion.  The gallbladder, pancreas, spleen,
adrenal glands, and kidneys are normal. Small nonenhancing cyst in
the left kidney.

The stomach, small bowel, and colon are normal.

Abdominal aorta is normal caliber.  No retroperitoneal or
periportal lymphadenopathy.

There is no free fluid in the pelvis.  Post hysterectomy anatomy.
No pelvic lymphadenopathy.  Bladder is normal.  No acute osseous
abnormality.

No evidence of inguinal or ventral hernia.
IMPRESSION: No acute abdominal or pelvic findings.

Post hysterectomy.

## 2015-02-22 ENCOUNTER — Ambulatory Visit (INDEPENDENT_AMBULATORY_CARE_PROVIDER_SITE_OTHER): Payer: 59 | Admitting: Family Medicine

## 2015-02-22 VITALS — BP 148/100 | HR 87 | Temp 98.6°F | Resp 16 | Ht 65.0 in | Wt 182.0 lb

## 2015-02-22 DIAGNOSIS — L03119 Cellulitis of unspecified part of limb: Secondary | ICD-10-CM

## 2015-02-22 DIAGNOSIS — L02419 Cutaneous abscess of limb, unspecified: Secondary | ICD-10-CM

## 2015-02-22 DIAGNOSIS — T63441A Toxic effect of venom of bees, accidental (unintentional), initial encounter: Secondary | ICD-10-CM

## 2015-02-22 DIAGNOSIS — T63444A Toxic effect of venom of bees, undetermined, initial encounter: Secondary | ICD-10-CM

## 2015-02-22 MED ORDER — DOXYCYCLINE HYCLATE 100 MG PO TABS: 100.0000 mg | ORAL_TABLET | Freq: Two times a day (BID) | ORAL | Status: DC

## 2015-02-22 NOTE — Patient Instructions (Signed)

## 2015-02-22 NOTE — Progress Notes (Signed)
Subjective:  This chart was scribed for Karen Sidle MD, by [name], at [facility]  This patient was seen in room 5 and the patient's care was started at 2:37 PM.   Chief Complaint  Patient presents with   Insect Bite    pt. thinks bee, x about hour ago      Patient ID: Karen Foley, female    DOB: [birth year], 49 y.o.   MRN: [id number]  HPI  HPI Comments: Karen Foley is a 49 y.o. female who presents to the [facility] complaining of a sharp pain that occurred about one hour ago in her left upper inner thigh area.  Patient went home right after and states that she pulled out the stinger.  She has associated symptoms of redness to the area and states that it is still intermittently painful.  She has put ice on it to alleviate her symptoms. She states that she will take advil at home for the pain.  She has no other complaints or concerns today. Patient works at [employer].     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 09/15/2012   OSA on CPAP 02/04/2012   Hypertension 02/04/2012   Hyperlipidemia 02/04/2012   Depression 02/04/2012   Past Medical History  Diagnosis Date   Diabetes mellitus    Hyperlipidemia    Hypertension    Depression    Seizures (HCC)    Thyroid disease    Asthma    Endometriosis     s/p LOA 2003   GERD (gastroesophageal reflux disease)    Colon polyps    Hx of varicella    Scoliosis    OSA on CPAP    Anxiety    Past Surgical History  Procedure Laterality Date   Ovarian cyst removal  2011    Mastoidectomy Right 2011   Laproscopy  [id number]   Breast biopsy  2008   Abdominal hysterectomy  2003    TAH BSO endometriosis   Mastoidectomy  2011   Allergies  Allergen Reactions   Clindamycin/Lincomycin Hives and Itching   Prior to Admission medications   Medication Sig Start Date End Date Taking? Authorizing Provider  citalopram (CELEXA) 20 MG tablet TAKE 1 TABLET BY MOUTH EVERY DAY OR AS DIRECTED 01/19/15  Yes Madelin Headings, MD  glucose blood (ONE TOUCH TEST STRIPS) test strip Test daily. 05/30/14  Yes Madelin Headings, MD  losartan-hydrochlorothiazide (HYZAAR) 100-25 MG per tablet Take 1 tablet by mouth daily. 12/13/14  Yes Madelin Headings, MD  metFORMIN (GLUCOPHAGE-XR) 500 MG 24 hr tablet Take 2 tablets (1,000 mg total) by mouth daily with breakfast. 08/29/14  Yes Madelin Headings, MD  [facility] DELICA LANCETS FINE MISC Test once daily 05/30/14  Yes Madelin Headings, MD  polyethylene glycol powder (GLYCOLAX/MIRALAX) powder Take 17 g by mouth daily. 01/10/15  Yes Madelin Headings, MD  rosuvastatin (CRESTOR) 10 MG tablet TAKE 1 TABLET (10 MG TOTAL) BY MOUTH DAILY. 05/30/14  Yes Madelin Headings, MD  cephALEXin (KEFLEX) 500 MG capsule Take 1 capsule by mouth 3 (three) times daily. 01/19/15   Historical Provider,  MD  clindamycin (CLEOCIN) 300 MG capsule Take 1 capsule by mouth every 6 (six) hours. 01/22/15   Historical Provider, MD  cyclobenzaprine (FLEXERIL) 10 MG tablet Take 10 mg by mouth 3 (three) times daily as needed for muscle spasms.    Historical Provider, MD  desonide (DESOWEN) 0.05 % cream Apply topically daily. Apply to face and breast    Historical Provider, MD  fish oil-omega-3 fatty acids 1000 MG capsule Take 2 g by mouth daily.    Historical Provider, MD   Social History   Social History   Marital Status: Single    Spouse Name: N/A   Number of Children: N/A   Years of Education: N/A   Occupational History   Not on file.   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  Eyes: Negative for pain, redness and itching.  Respiratory: Negative for cough, choking and shortness of breath.   Gastrointestinal: Negative for nausea and vomiting.  Musculoskeletal: Negative for neck pain and neck stiffness.  Skin: Positive for color change.       Objective:   Physical Exam  Constitutional: She appears well-developed and well-nourished. No distress.  HENT:  Head: Normocephalic and atraumatic.  Eyes: Pupils are equal, round, and reactive to light.  Neck: Neck supple.  Pulmonary/Chest: No respiratory distress.  Neurological: She is alert.  Skin: Skin is warm and dry.  Left inner thigh area is red and inflamed. It was central 1 mm pustule.    Psychiatric: She has a normal mood and affect. Her behavior is normal.  Nursing note and vitals reviewed.  Filed Vitals:   02/22/15 1400  BP: 148/100  Pulse: 87  Temp: 98.6 F (37 C)  TempSrc: Oral  Resp: 16  Height:  (1.651 m)  Weight: 182 lb (82.555 kg)  SpO2: 98%        Assessment & Plan:   This chart was scribed in my presence and reviewed by me personally.    ICD-9-CM ICD-10-CM   1. Bee sting, undetermined intent, initial encounter 989.5 T63.444A doxycycline (VIBRA-TABS) 100 MG tablet   E980.9    2. Cellulitis and abscess of leg 682.6 L02.419 doxycycline (VIBRA-TABS) 100 MG tablet    L03.119      Signed, Karen Sidle, MD

## 2015-02-25 ENCOUNTER — Other Ambulatory Visit: Payer: Self-pay | Admitting: Internal Medicine

## 2015-02-27 NOTE — Telephone Encounter (Signed)
Filled on 01/19/15 for one year.

## 2015-03-29 ENCOUNTER — Other Ambulatory Visit: Payer: Self-pay | Admitting: Internal Medicine

## 2015-03-31 NOTE — Telephone Encounter (Signed)
Denied.  Filled on 01/19/15 for 1 year.  Spoke to the pharmacy.  The prescription was placed on hold.  Did not state why.  Asked that the prescription be filled for the pt.

## 2015-07-14 ENCOUNTER — Ambulatory Visit: Payer: 59 | Admitting: Internal Medicine

## 2015-08-01 ENCOUNTER — Other Ambulatory Visit: Payer: Self-pay | Admitting: Family Medicine

## 2015-08-01 MED ORDER — METFORMIN HCL ER 500 MG PO TB24: 1000.0000 mg | ORAL_TABLET | Freq: Every day | ORAL | Status: DC

## 2015-08-01 NOTE — Telephone Encounter (Signed)
Sent to the pharmacy by e-scribe.  Pt has upcoming appt on 08/18/15

## 2015-08-17 NOTE — Progress Notes (Signed)
Document opened and reviewed for OV but appt  canceled same day .  

## 2015-08-18 ENCOUNTER — Telehealth: Payer: Self-pay | Admitting: Family Medicine

## 2015-08-18 ENCOUNTER — Encounter: Payer: 59 | Admitting: Internal Medicine

## 2015-08-18 NOTE — Telephone Encounter (Signed)
[facility] - Client Nonclinical Telephone Record [facility] Client [facility] - Client Client Site [facility] - Night Physician Berniece AndreasPanosh, Wanda Contact Type Call Who Is Calling Patient / Member / Family / Caregiver Caller Name Karen MilchCarol Foley Caller Phone Number [phone] Patient Name Karen Foley Call Type Message Only Information Provided [facility] for [facility] Initial Comment Caller states Karen Foley, has appt tomorrow. Needs to cancel. Additional Comment Call Closed By: Richardean ChimeraEric Freeman Transaction Date/Time: 08/17/2015 5:41:17 PM (ET)

## 2015-08-24 ENCOUNTER — Ambulatory Visit: Payer: 59 | Admitting: Internal Medicine

## 2015-09-19 ENCOUNTER — Ambulatory Visit: Payer: 59 | Admitting: Internal Medicine

## 2015-10-17 ENCOUNTER — Ambulatory Visit: Payer: 59 | Admitting: Internal Medicine

## 2015-11-09 NOTE — Progress Notes (Signed)
Pre visit review using our clinic review tool, if applicable. No additional management support is needed unless otherwise documented below in the visit note.  Chief Complaint  Patient presents with  . Follow-up    HPI: Karen Foley 50 y.o.   Last visit 8 2016    Ha s dm osa ht  Endometriosis  She has had delay getting into her follow-up visit as scheduled and canceled 5 times. Lots of stress going on see below. His aware she needs to take better care of herself. She has not been to Dr. Chalmers Cater since last fall.   DM ; Went to appt  But not to go Dr Chalmers Cater . Over a year.  Not checking sugars  A few weeks  Eye exam  Yearly  okNo neuropathy .   Job labs   Lipids was not good. March .  A 1c ? Doesn't remember  Higher than should be .  Taking metformin  2 500 per day  out for weeks    BP:  Taking meds   Still inger around 140  No reg exercise .   Lipid not taking crestor   Ran out  And missed   Pill  Months   Citalopram :   Not taking    Runs out.   10 - 12 hour days and misses med    Found method   . Off for months .? If helped  Did in past after parent died   Feels stalking issues  In the past  Year or so .   Safer a t work at [employer]  And so  Lets herself work longer  hours  OSA :   Company separated .   insuance change and didn't get tests.  But has sx of awakening   And sleeping upright  ROS: See pertinent positives and negatives per HPI. No ulcer feet problems   Past Medical History  Diagnosis Date  . Diabetes mellitus   . Hyperlipidemia   . Hypertension   . Depression   . Seizures ([place])   . Thyroid disease   . Asthma   . Endometriosis     s/p LOA 2003  . GERD (gastroesophageal reflux disease)   . Colon polyps   . Hx of varicella   . Scoliosis   . OSA on CPAP   . Anxiety     Family History  Problem Relation Age of Onset  . Hypertension Mother   . Breast cancer Mother 64    breast  . Diabetes Mother   . Prostate cancer Father     prostate  . Diabetes  Father   . Hypertension Father   . Goiter Maternal Grandmother   . Diabetes Maternal Grandmother   . Diabetes Maternal Grandfather   . Diabetes Paternal Grandmother     Social History   Social History  . Marital Status: Single    Spouse Name: N/A  . Number of Children: N/A  . Years of Education: N/A   Social History Main Topics  . Smoking status: Never Smoker   . Smokeless tobacco: Never Used  . Alcohol Use: No  . Drug Use: No  . Sexual Activity: No   Other Topics Concern  . 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 long busy schedule    No pets  Sleep 4-6 hours    negtad ocass caffiene   No ets fa   [id number]    Outpatient Prescriptions Prior to Visit  Medication Sig Dispense Refill  . glucose blood (ONE TOUCH TEST STRIPS) test strip Test daily. 100 each 12  . ONETOUCH DELICA LANCETS FINE MISC Test once daily 100 each 5  . polyethylene glycol powder (GLYCOLAX/MIRALAX) powder Take 17 g by mouth daily. 3350 g 1  . citalopram (CELEXA) 20 MG tablet TAKE 1 TABLET BY MOUTH EVERY DAY OR AS DIRECTED 90 tablet 3  . losartan-hydrochlorothiazide (HYZAAR) 100-25 MG per tablet Take 1 tablet by mouth daily. 90 tablet 3  . metFORMIN (GLUCOPHAGE-XR) 500 MG 24 hr tablet Take 2 tablets (1,000 mg total) by mouth daily with breakfast. 180 tablet 0  . rosuvastatin (CRESTOR) 10 MG tablet TAKE 1 TABLET (10 MG TOTAL) BY MOUTH DAILY. 30 tablet 12  . cyclobenzaprine (FLEXERIL) 10 MG tablet Take 10 mg by mouth 3 (three) times daily as needed for muscle spasms.    Marland Kitchen desonide (DESOWEN) 0.05 % cream Apply topically daily. Apply to face and breast    . doxycycline (VIBRA-TABS) 100 MG tablet Take 1 tablet (100 mg total) by mouth 2 (two) times daily. 20 tablet 0  . fish oil-omega-3 fatty acids 1000 MG capsule Take 2 g by mouth daily.     No facility-administered medications prior to visit.      EXAM:  BP 138/84 mmHg  Temp(Src) 98.4 F (36.9 C) (Oral)  Ht 5' 5"  (1.651 m)  Wt 180 lb (81.647 kg)  BMI 29.95 kg/m2  Body mass index is 29.95 kg/(m^2).  GENERAL: vitals reviewed and listed above, alert, oriented, appears well hydrated and in no acute distress HEENT: atraumatic, conjunctiva  clear, no obvious abnormalities on inspection of external nose and ears OP : no lesion edema or exudate  NECK: no obvious masses on inspection palpation  LUNGS: clear to auscultation bilaterally, no wheezes, rales or rhonchi, good air movement CV: HRRR, no clubbing cyanosis or  peripheral edema nl cap refill  Abdomen:  Sof,t normal bowel sounds without hepatosplenomegaly, no guarding rebound or masses no CVA tenderness  MS: moves all extremities without noticeable focal  abnormality PSYCH: pleasant and cooperative, no obvious depression or anxiety  Diabetes Health Maintenance Due  Topic Date Due  . FOOT EXAM  04/11/1975  . OPHTHALMOLOGY EXAM  05/07/2013  . HEMOGLOBIN A1C  02/28/2015   Wt Readings from Last 3 Encounters:  11/10/15 180 lb (81.647 kg)  02/22/15 182 lb (82.555 kg)  01/25/15 182 lb (82.555 kg)   BP Readings from Last 3 Encounters:  11/10/15 138/84  02/22/15 148/100  01/25/15 167/105     ASSESSMENT AND PLAN:  Discussed the following assessment and plan:  Diabetes mellitus without complication ([place]) - Plan: Basic metabolic panel, CBC with Differential/Platelet, Hemoglobin A1c, Hepatic function panel, TSH  Essential hypertension - Plan: Basic metabolic panel, CBC with Differential/Platelet, Hemoglobin A1c, Hepatic function panel, TSH  Hyperlipidemia - Plan: Basic metabolic panel, CBC with Differential/Platelet, Hemoglobin A1c, Hepatic function panel, TSH  Depression - Go back on citalopram and will follow-up in 3-4 months. May help her with her external stresses. She tolerated it well in the past. - Plan: Basic metabolic panel, CBC with Differential/Platelet,  Hemoglobin A1c, Hepatic function panel, TSH  OSA (obstructive sleep apnea) - Delayed evaluation insurance changes.  - Plan: Basic metabolic panel, CBC with Differential/Platelet, Hemoglobin A1c, Hepatic function panel, TSH  Hx of Graves' disease Way overdue  For  Monitoring  She is working 10-12 hour days frequently. She agrees that she needs to take better care of herself and take her medicines and monitor. She will make an appointment with endocrinology. We'll refill her medicines in the short run restart citalopram to see if it helps keeping her on track focusing to get herself medications etc. Discussed strategies. Her psychological stress continues. Check laboratory monitoring today send copy to Dr. Chalmers Cater and review. -Patient advised to return or notify health care team  if symptoms worsen ,persist or new concerns arise. Total visit 40mns > 50% spent counseling and coordinating care as indicated in above note and in instructions to patient .     Patient Instructions  Very important to your health to get back on track. Make an appointment with Dr. VFernande Boyden We'll do laboratory testing today excluding lipids because you've been off your Crestor. Suggest restarting the citalopram for reasons discussed. He needs some daylight at least 30 minutes a day. Continue with support system. Exercise will help stress. Plan follow-up with [facility] lab. Take blood pressure readings twice a day for 7- 10 days and then periodically .To ensure below 140/90   .Send in readings      OV in 3-4 months    Or as needed  Diabetes and Standards of Medical Care Diabetes is complicated. You may find that your diabetes team includes a dietitian, nurse, diabetes educator, eye doctor, and more. To help everyone know what is going on and to help you get the care you deserve, the following schedule of care was developed to help keep you on track. Below are the tests, exams, vaccines, medicines, education, and  plans you will need. HbA1c test This test shows how well you have controlled your glucose over the past 2-3 months. It is used to see if your diabetes management plan needs to be adjusted.   It is performed at least 2 times a year if you are meeting treatment goals.  It is performed 4 times a year if therapy has changed or if you are not meeting treatment goals. Blood pressure test  This test is performed at every routine medical visit. The goal is less than 140/90 mm Hg for most people, but 130/80 mm Hg in some cases. Ask your health care provider about your goal. Dental exam  Follow up with the dentist regularly. Eye exam  If you are diagnosed with type 1 diabetes as a child, get an exam upon reaching the age of 136years or older and having had diabetes for 3-5 years. Yearly eye exams are recommended after that initial eye exam.  If you are diagnosed with type 1 diabetes as an adult, get an exam within 5 years of diagnosis and then yearly.  If you are diagnosed with type 2 diabetes, get an exam as soon as possible after the diagnosis and then yearly. Foot care exam  Visual foot exams are performed at every routine medical visit. The exams check for cuts, injuries, or other problems with the feet.  You should have a complete foot exam performed every year. This exam includes an inspection of the structure and skin of your feet, a check of the pulses in your feet, and a check of the sensation in your feet.  Type 1 diabetes: The first exam is performed 5 years after diagnosis.  Type 2 diabetes: The first exam is performed at the time of diagnosis.  Check your feet nightly for cuts, injuries, or  other problems with your feet. Tell your health care provider if anything is not healing. Kidney function test (urine microalbumin)  This test is performed once a year.  Type 1 diabetes: The first test is performed 5 years after diagnosis.  Type 2 diabetes: The first test is performed at  the time of diagnosis.  A serum creatinine and estimated glomerular filtration rate (eGFR) test is done once a year to assess the level of chronic kidney disease (CKD), if present. Lipid profile (cholesterol, HDL, LDL, triglycerides)  Performed every 5 years for most people.  The goal for LDL is less than 100 mg/dL. If you are at high risk, the goal is less than 70 mg/dL.  The goal for HDL is 40 mg/dL-50 mg/dL for men and 50 mg/dL-60 mg/dL for women. An HDL cholesterol of 60 mg/dL or higher gives some protection against heart disease.  The goal for triglycerides is less than 150 mg/dL. Immunizations  The flu (influenza) vaccine is recommended yearly for every person 51 months of age or older who has diabetes.  The pneumonia (pneumococcal) vaccine is recommended for every person 47 years of age or older who has diabetes. Adults 8 years of age or older may receive the pneumonia vaccine as a series of two separate shots.  The hepatitis B vaccine is recommended for adults shortly after they have been diagnosed with diabetes.  The Tdap (tetanus, diphtheria, and pertussis) vaccine should be given:  According to normal childhood vaccination schedules, for children.  Every 10 years, for adults who have diabetes. Diabetes self-management education  Education is recommended at diagnosis and ongoing as needed. Treatment plan  Your treatment plan is reviewed at every medical visit.   This information is not intended to replace advice given to you by your health care provider. Make sure you discuss any questions you have with your health care provider.   Document Released: 03/03/2009 Document Revised: 05/27/2014 Document Reviewed: 10/06/2012 Elsevier Interactive Patient Education 2016 [place] and Stress Management Stress is a normal reaction to life events. It is what you feel when life demands more than you are used to or more than you can handle. Some stress can be useful.  For example, the stress reaction can help you catch the last bus of the day, study for a test, or meet a deadline at work. But stress that occurs too often or for too long can cause problems. It can affect your emotional health and interfere with relationships and normal daily activities. Too much stress can weaken your immune system and increase your risk for physical illness. If you already have a medical problem, stress can make it worse. CAUSES  All sorts of life events may cause stress. An event that causes stress for one person may not be stressful for another person. Major life events commonly cause stress. These may be positive or negative. Examples include losing your job, moving into a new home, getting married, having a baby, or losing a loved one. Less obvious life events may also cause stress, especially if they occur day after day or in combination. Examples include working long hours, driving in traffic, caring for children, being in debt, or being in a difficult relationship. SIGNS AND SYMPTOMS Stress may cause emotional symptoms including, the following:  Anxiety. This is feeling worried, afraid, on edge, overwhelmed, or out of control.  Anger. This is feeling irritated or impatient.  Depression. This is feeling sad, down, helpless, or guilty.  Difficulty focusing, remembering,  or making decisions. Stress may cause physical symptoms, including the following:   Aches and pains. These may affect your head, neck, back, stomach, or other areas of your body.  Tight muscles or clenched jaw.  Low energy or trouble sleeping. Stress may cause unhealthy behaviors, including the following:   Eating to feel better (overeating) or skipping meals.  Sleeping too little, too much, or both.  Working too much or putting off tasks (procrastination).  Smoking, drinking alcohol, or using drugs to feel better. DIAGNOSIS  Stress is diagnosed through an assessment by your health care  provider. Your health care provider will ask questions about your symptoms and any stressful life events.Your health care provider will also ask about your medical history and may order blood tests or other tests. Certain medical conditions and medicine can cause physical symptoms similar to stress. Mental illness can cause emotional symptoms and unhealthy behaviors similar to stress. Your health care provider may refer you to a mental health professional for further evaluation.  TREATMENT  Stress management is the recommended treatment for stress.The goals of stress management are reducing stressful life events and coping with stress in healthy ways.  Techniques for reducing stressful life events include the following:  Stress identification. Self-monitor for stress and identify what causes stress for you. These skills may help you to avoid some stressful events.  Time management. Set your priorities, keep a calendar of events, and learn to say "no." These tools can help you avoid making too many commitments. Techniques for coping with stress include the following:  Rethinking the problem. Try to think realistically about stressful events rather than ignoring them or overreacting. Try to find the positives in a stressful situation rather than focusing on the negatives.  Exercise. Physical exercise can release both physical and emotional tension. The key is to find a form of exercise you enjoy and do it regularly.  Relaxation techniques. These relax the body and mind. Examples include yoga, meditation, tai chi, biofeedback, deep breathing, progressive muscle relaxation, listening to music, being out in nature, journaling, and other hobbies. Again, the key is to find one or more that you enjoy and can do regularly.  Healthy lifestyle. Eat a balanced diet, get plenty of sleep, and do not smoke. Avoid using alcohol or drugs to relax.  Strong support network. Spend time with family, friends, or  other people you enjoy being around.Express your feelings and talk things over with someone you trust. Counseling or talktherapy with a mental health professional may be helpful if you are having difficulty managing stress on your own. Medicine is typically not recommended for the treatment of stress.Talk to your health care provider if you think you need medicine for symptoms of stress. HOME CARE INSTRUCTIONS  Keep all follow-up visits as directed by your health care provider.  Take all medicines as directed by your health care provider. SEEK MEDICAL CARE IF:  Your symptoms get worse or you start having new symptoms.  You feel overwhelmed by your problems and can no longer manage them on your own. SEEK IMMEDIATE MEDICAL CARE IF:  You feel like hurting yourself or someone else.   This information is not intended to replace advice given to you by your health care provider. Make sure you discuss any questions you have with your health care provider.   Document Released: 10/30/2000 Document Revised: 05/27/2014 Document Reviewed: 12/29/2012 Elsevier Interactive Patient Education 2016 Solis K. Panosh M.D. Lab Results  Component Value Date   WBC 8.4 11/10/2015   HGB 13.8 11/10/2015   HCT 42.0 11/10/2015   PLT 234.0 11/10/2015   GLUCOSE 132* 11/10/2015   CHOL 185 08/29/2014   TRIG 70.0 08/29/2014   HDL 49.50 08/29/2014   LDLCALC 122* 08/29/2014   ALT 17 11/10/2015   AST 14 11/10/2015   NA 141 11/10/2015   K 3.4* 11/10/2015   CL 102 11/10/2015   CREATININE 0.82 11/10/2015   BUN 14 11/10/2015   CO2 31 11/10/2015   TSH 0.92 11/10/2015   HGBA1C 7.2* 11/10/2015   MICROALBUR 0.92 10/21/2012

## 2015-11-09 NOTE — Assessment & Plan Note (Signed)
Late for follow up  Late cancelled appt in March

## 2015-11-09 NOTE — Patient Instructions (Addendum)
Very important to your health to get back on track. Make an appointment with Dr. Fernande Boyden. We'll do laboratory testing today excluding lipids because you've been off your Crestor. Suggest restarting the citalopram for reasons discussed. He needs some daylight at least 30 minutes a day. Continue with support system. Exercise will help stress. Plan follow-up with [place] depending on lab. Take blood pressure readings twice a day for 7- 10 days and then periodically .To ensure below 140/90   .Send in readings      OV in 3-4 months    Or as needed  Diabetes and Standards of Medical Care Diabetes is complicated. You may find that your diabetes team includes a dietitian, nurse, diabetes educator, eye doctor, and more. To help everyone know what is going on and to help you get the care you deserve, the following schedule of care was developed to help keep you on track. Below are the tests, exams, vaccines, medicines, education, and plans you will need. HbA1c test This test shows how well you have controlled your glucose over the past 2-3 months. It is used to see if your diabetes management plan needs to be adjusted.   It is performed at least 2 times a year if you are meeting treatment goals.  It is performed 4 times a year if therapy has changed or if you are not meeting treatment goals. Blood pressure test  This test is performed at every routine medical visit. The goal is less than 140/90 mm Hg for most people, but 130/80 mm Hg in some cases. Ask your health care provider about your goal. Dental exam  Follow up with the dentist regularly. Eye exam  If you are diagnosed with type 1 diabetes as a child, get an exam upon reaching the age of 36 years or older and having had diabetes for 3-5 years. Yearly eye exams are recommended after that initial eye exam.  If you are diagnosed with type 1 diabetes as an adult, get an exam within 5 years of diagnosis and then yearly.  If you are diagnosed with  type 2 diabetes, get an exam as soon as possible after the diagnosis and then yearly. Foot care exam  Visual foot exams are performed at every routine medical visit. The exams check for cuts, injuries, or other problems with the feet.  You should have a complete foot exam performed every year. This exam includes an inspection of the structure and skin of your feet, a check of the pulses in your feet, and a check of the sensation in your feet.  Type 1 diabetes: The first exam is performed 5 years after diagnosis.  Type 2 diabetes: The first exam is performed at the time of diagnosis.  Check your feet nightly for cuts, injuries, or other problems with your feet. Tell your health care provider if anything is not healing. Kidney function test (urine microalbumin)  This test is performed once a year.  Type 1 diabetes: The first test is performed 5 years after diagnosis.  Type 2 diabetes: The first test is performed at the time of diagnosis.  A serum creatinine and estimated glomerular filtration rate (eGFR) test is done once a year to assess the level of chronic kidney disease (CKD), if present. Lipid profile (cholesterol, HDL, LDL, triglycerides)  Performed every 5 years for most people.  The goal for LDL is less than 100 mg/dL. If you are at high risk, the goal is less than 70 mg/dL.  The goal for  HDL is 40 mg/dL-50 mg/dL for men and 50 mg/dL-60 mg/dL for women. An HDL cholesterol of 60 mg/dL or higher gives some protection against heart disease.  The goal for triglycerides is less than 150 mg/dL. Immunizations  The flu (influenza) vaccine is recommended yearly for every person 50 months of age or older who has diabetes.  The pneumonia (pneumococcal) vaccine is recommended for every person 78 years of age or older who has diabetes. Adults 33 years of age or older may receive the pneumonia vaccine as a series of two separate shots.  The hepatitis B vaccine is recommended for adults  shortly after they have been diagnosed with diabetes.  The Tdap (tetanus, diphtheria, and pertussis) vaccine should be given:  According to normal childhood vaccination schedules, for children.  Every 10 years, for adults who have diabetes. Diabetes self-management education  Education is recommended at diagnosis and ongoing as needed. Treatment plan  Your treatment plan is reviewed at every medical visit.   This information is not intended to replace advice given to you by your health care provider. Make sure you discuss any questions you have with your health care provider.   Document Released: 03/03/2009 Document Revised: 05/27/2014 Document Reviewed: 10/06/2012 Elsevier Interactive Patient Education 2016 [employer] and Stress Management Stress is a normal reaction to life events. It is what you feel when life demands more than you are used to or more than you can handle. Some stress can be useful. For example, the stress reaction can help you catch the last bus of the day, study for a test, or meet a deadline at work. But stress that occurs too often or for too long can cause problems. It can affect your emotional health and interfere with relationships and normal daily activities. Too much stress can weaken your immune system and increase your risk for physical illness. If you already have a medical problem, stress can make it worse. CAUSES  All sorts of life events may cause stress. An event that causes stress for one person may not be stressful for another person. Major life events commonly cause stress. These may be positive or negative. Examples include losing your job, moving into a new home, getting married, having a baby, or losing a loved one. Less obvious life events may also cause stress, especially if they occur day after day or in combination. Examples include working long hours, driving in traffic, caring for children, being in debt, or being in a difficult  relationship. SIGNS AND SYMPTOMS Stress may cause emotional symptoms including, the following:  Anxiety. This is feeling worried, afraid, on edge, overwhelmed, or out of control.  Anger. This is feeling irritated or impatient.  Depression. This is feeling sad, down, helpless, or guilty.  Difficulty focusing, remembering, or making decisions. Stress may cause physical symptoms, including the following:   Aches and pains. These may affect your head, neck, back, stomach, or other areas of your body.  Tight muscles or clenched jaw.  Low energy or trouble sleeping. Stress may cause unhealthy behaviors, including the following:   Eating to feel better (overeating) or skipping meals.  Sleeping too little, too much, or both.  Working too much or putting off tasks (procrastination).  Smoking, drinking alcohol, or using drugs to feel better. DIAGNOSIS  Stress is diagnosed through an assessment by your health care provider. Your health care provider will ask questions about your symptoms and any stressful life events.Your health care provider will also ask about your  medical history and may order blood tests or other tests. Certain medical conditions and medicine can cause physical symptoms similar to stress. Mental illness can cause emotional symptoms and unhealthy behaviors similar to stress. Your health care provider may refer you to a mental health professional for further evaluation.  TREATMENT  Stress management is the recommended treatment for stress.The goals of stress management are reducing stressful life events and coping with stress in healthy ways.  Techniques for reducing stressful life events include the following:  Stress identification. Self-monitor for stress and identify what causes stress for you. These skills may help you to avoid some stressful events.  Time management. Set your priorities, keep a calendar of events, and learn to say "no." These tools can help you  avoid making too many commitments. Techniques for coping with stress include the following:  Rethinking the problem. Try to think realistically about stressful events rather than ignoring them or overreacting. Try to find the positives in a stressful situation rather than focusing on the negatives.  Exercise. Physical exercise can release both physical and emotional tension. The key is to find a form of exercise you enjoy and do it regularly.  Relaxation techniques. These relax the body and mind. Examples include yoga, meditation, tai chi, biofeedback, deep breathing, progressive muscle relaxation, listening to music, being out in nature, journaling, and other hobbies. Again, the key is to find one or more that you enjoy and can do regularly.  Healthy lifestyle. Eat a balanced diet, get plenty of sleep, and do not smoke. Avoid using alcohol or drugs to relax.  Strong support network. Spend time with family, friends, or other people you enjoy being around.Express your feelings and talk things over with someone you trust. Counseling or talktherapy with a mental health professional may be helpful if you are having difficulty managing stress on your own. Medicine is typically not recommended for the treatment of stress.Talk to your health care provider if you think you need medicine for symptoms of stress. HOME CARE INSTRUCTIONS  Keep all follow-up visits as directed by your health care provider.  Take all medicines as directed by your health care provider. SEEK MEDICAL CARE IF:  Your symptoms get worse or you start having new symptoms.  You feel overwhelmed by your problems and can no longer manage them on your own. SEEK IMMEDIATE MEDICAL CARE IF:  You feel like hurting yourself or someone else.   This information is not intended to replace advice given to you by your health care provider. Make sure you discuss any questions you have with your health care provider.   Document Released:  10/30/2000 Document Revised: 05/27/2014 Document Reviewed: 12/29/2012 Elsevier Interactive Patient Education [employer].

## 2015-11-10 ENCOUNTER — Ambulatory Visit (INDEPENDENT_AMBULATORY_CARE_PROVIDER_SITE_OTHER): Payer: 59 | Admitting: Internal Medicine

## 2015-11-10 ENCOUNTER — Encounter: Payer: Self-pay | Admitting: Internal Medicine

## 2015-11-10 VITALS — BP 138/84 | Temp 98.4°F | Ht 65.0 in | Wt 180.0 lb

## 2015-11-10 DIAGNOSIS — E119 Type 2 diabetes mellitus without complications: Secondary | ICD-10-CM | POA: Diagnosis not present

## 2015-11-10 DIAGNOSIS — E785 Hyperlipidemia, unspecified: Secondary | ICD-10-CM | POA: Diagnosis not present

## 2015-11-10 DIAGNOSIS — F329 Major depressive disorder, single episode, unspecified: Secondary | ICD-10-CM | POA: Diagnosis not present

## 2015-11-10 DIAGNOSIS — G4733 Obstructive sleep apnea (adult) (pediatric): Secondary | ICD-10-CM

## 2015-11-10 DIAGNOSIS — I10 Essential (primary) hypertension: Secondary | ICD-10-CM | POA: Diagnosis not present

## 2015-11-10 DIAGNOSIS — Z8639 Personal history of other endocrine, nutritional and metabolic disease: Secondary | ICD-10-CM

## 2015-11-10 DIAGNOSIS — F32A Depression, unspecified: Secondary | ICD-10-CM

## 2015-11-10 LAB — HEMOGLOBIN A1C: Hgb A1c MFr Bld: 7.2 % — ABNORMAL HIGH (ref 4.6–6.5)

## 2015-11-10 LAB — HEPATIC FUNCTION PANEL
ALK PHOS: 68 U/L (ref 39–117)
ALT: 17 U/L (ref 0–35)
AST: 14 U/L (ref 0–37)
Albumin: 4.3 g/dL (ref 3.5–5.2)
BILIRUBIN TOTAL: 0.5 mg/dL (ref 0.2–1.2)
Bilirubin, Direct: 0 mg/dL (ref 0.0–0.3)
Total Protein: 7.2 g/dL (ref 6.0–8.3)

## 2015-11-10 LAB — CBC WITH DIFFERENTIAL/PLATELET
Basophils Absolute: 0 10*3/uL (ref 0.0–0.1)
Basophils Relative: 0.2 % (ref 0.0–3.0)
EOS ABS: 0.1 10*3/uL (ref 0.0–0.7)
Eosinophils Relative: 1.6 % (ref 0.0–5.0)
HCT: 42 % (ref 36.0–46.0)
HEMOGLOBIN: 13.8 g/dL (ref 12.0–15.0)
LYMPHS PCT: 26.2 % (ref 12.0–46.0)
Lymphs Abs: 2.2 10*3/uL (ref 0.7–4.0)
MCHC: 32.8 g/dL (ref 30.0–36.0)
MCV: 90.2 fl (ref 78.0–100.0)
Monocytes Absolute: 0.6 10*3/uL (ref 0.1–1.0)
Monocytes Relative: 7.1 % (ref 3.0–12.0)
Neutro Abs: 5.5 10*3/uL (ref 1.4–7.7)
Neutrophils Relative %: 64.9 % (ref 43.0–77.0)
Platelets: 234 10*3/uL (ref 150.0–400.0)
RBC: 4.66 Mil/uL (ref 3.87–5.11)
RDW: 13.8 % (ref 11.5–15.5)
WBC: 8.4 10*3/uL (ref 4.0–10.5)

## 2015-11-10 LAB — BASIC METABOLIC PANEL
BUN: 14 mg/dL (ref 6–23)
CHLORIDE: 102 meq/L (ref 96–112)
CO2: 31 mEq/L (ref 19–32)
CREATININE: 0.82 mg/dL (ref 0.40–1.20)
Calcium: 9.6 mg/dL (ref 8.4–10.5)
GFR: 94.68 mL/min (ref >60.00)
Glucose, Bld: 132 mg/dL — ABNORMAL HIGH (ref 70–99)
Potassium: 3.4 mEq/L — ABNORMAL LOW (ref 3.5–5.1)
Sodium: 141 mEq/L (ref 135–145)

## 2015-11-10 LAB — TSH: TSH: 0.92 u[IU]/mL (ref 0.35–4.50)

## 2015-11-10 MED ORDER — METFORMIN HCL ER 500 MG PO TB24: 1000.0000 mg | ORAL_TABLET | Freq: Every day | ORAL | Status: DC

## 2015-11-10 MED ORDER — ROSUVASTATIN CALCIUM 10 MG PO TABS: ORAL_TABLET | ORAL | Status: DC

## 2015-11-10 MED ORDER — CITALOPRAM HYDROBROMIDE 20 MG PO TABS: ORAL_TABLET | ORAL | Status: DC

## 2015-11-10 MED ORDER — LOSARTAN POTASSIUM-HCTZ 100-25 MG PO TABS: 1.0000 | ORAL_TABLET | Freq: Every day | ORAL | Status: DC

## 2015-11-15 ENCOUNTER — Other Ambulatory Visit: Payer: Self-pay | Admitting: Family Medicine

## 2015-11-15 DIAGNOSIS — E876 Hypokalemia: Secondary | ICD-10-CM

## 2015-12-25 ENCOUNTER — Other Ambulatory Visit (INDEPENDENT_AMBULATORY_CARE_PROVIDER_SITE_OTHER): Payer: 59

## 2015-12-25 DIAGNOSIS — E876 Hypokalemia: Secondary | ICD-10-CM | POA: Diagnosis not present

## 2015-12-25 LAB — BASIC METABOLIC PANEL
BUN: 15 mg/dL (ref 6–23)
CALCIUM: 9.2 mg/dL (ref 8.4–10.5)
CO2: 29 meq/L (ref 19–32)
Chloride: 101 mEq/L (ref 96–112)
Creatinine, Ser: 0.87 mg/dL (ref 0.40–1.20)
GFR: 88.38 mL/min (ref >60.00)
Glucose, Bld: 180 mg/dL — ABNORMAL HIGH (ref 70–99)
Potassium: 3.6 mEq/L (ref 3.5–5.1)
SODIUM: 140 meq/L (ref 135–145)

## 2015-12-29 ENCOUNTER — Other Ambulatory Visit: Payer: Self-pay | Admitting: *Deleted

## 2015-12-29 NOTE — Telephone Encounter (Signed)
[facility] requested a 90 day supply as the last Rx was for a 30 day supply.

## 2016-01-02 MED ORDER — ROSUVASTATIN CALCIUM 10 MG PO TABS: ORAL_TABLET | ORAL | 0 refills | Status: DC

## 2016-01-02 NOTE — Telephone Encounter (Signed)
Sent to the pharmacy by e-scribe. 

## 2016-01-02 NOTE — Telephone Encounter (Signed)
Ok to refill   For 90 days  She has app t in  A month or so and we can do lipids at that times if not done elsewherer

## 2016-01-02 NOTE — Telephone Encounter (Signed)
Over 1 year since last lipid.  Please advise.

## 2016-02-05 ENCOUNTER — Other Ambulatory Visit: Payer: Self-pay | Admitting: Internal Medicine

## 2016-02-05 DIAGNOSIS — E119 Type 2 diabetes mellitus without complications: Secondary | ICD-10-CM

## 2016-02-07 NOTE — Telephone Encounter (Signed)
Sent to the pharmacy by e-scribe. 

## 2016-02-09 ENCOUNTER — Ambulatory Visit: Payer: 59 | Admitting: Internal Medicine

## 2016-03-08 ENCOUNTER — Ambulatory Visit: Payer: 59 | Admitting: Internal Medicine

## 2016-03-27 ENCOUNTER — Other Ambulatory Visit: Payer: Self-pay | Admitting: Internal Medicine

## 2016-03-28 NOTE — Telephone Encounter (Signed)
Sent to the pharmacy by e-scribe. 

## 2016-03-28 NOTE — Telephone Encounter (Signed)
Ok to refill 90 days   She has appt  End of this month

## 2016-03-28 NOTE — Telephone Encounter (Signed)
Pt seen in June. Note says to follow up in 3 months depending on labs. Lab result does not say when to return.  December?

## 2016-04-07 NOTE — Progress Notes (Deleted)
No chief complaint on file.   HPI: Marry GuanCarol A Brocious 50 y.o.    Fu disease manamgnet  For setpember    Citalopram  DM LIPID  OSA Graves   ROS: See pertinent positives and negatives per HPI.  Past Medical History:  Diagnosis Date  . Anxiety   . Asthma   . Colon polyps   . Depression   . Diabetes mellitus   . Endometriosis    s/p LOA 2003  . GERD (gastroesophageal reflux disease)   . Hx of varicella   . Hyperlipidemia   . Hypertension   . OSA on CPAP   . Scoliosis   . Seizures (HCC)   . Thyroid disease     Family History  Problem Relation Age of Onset  . Hypertension Mother   . Breast cancer Mother [id number]    breast  . Diabetes Mother   . Prostate cancer Father     prostate  . Diabetes Father   . Hypertension Father   . Goiter Maternal Grandmother   . Diabetes Maternal Grandmother   . Diabetes Maternal Grandfather   . Diabetes Paternal Grandmother     Social History   Social History  . Marital status: Single    Spouse name: N/A  . Number of children: N/A  . Years of education: N/A   Social History Main Topics  . Smoking status: Never Smoker  . Smokeless tobacco: Never Used  . Alcohol use No  . Drug use: No  . Sexual activity: No   Other Topics Concern  . 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 long busy schedule    No pets    Sleep 4-6 hours    negtad ocass caffiene   No ets fa   G0P0    Outpatient Medications Prior to Visit  Medication Sig Dispense Refill  . citalopram (CELEXA) 20 MG tablet TAKE 1 TABLET BY MOUTH EVERY DAY OR AS DIRECTED 30 tablet 5  . losartan-hydrochlorothiazide (HYZAAR) 100-25 MG tablet Take 1 tablet by mouth daily. 30 tablet 11  . metFORMIN (GLUCOPHAGE-XR) 500 MG 24 hr tablet TAKE 2 TABLETS BY MOUTH DAILY WITH BREAKFAST. 60 tablet 0  . ONE TOUCH ULTRA TEST test strip TEST DAILY AS DIRECTED 100 each 5    . ONETOUCH DELICA LANCETS FINE MISC Test once daily 100 each 5  . polyethylene glycol powder (GLYCOLAX/MIRALAX) powder Take 17 g by mouth daily. 3350 g 1  . rosuvastatin (CRESTOR) 10 MG tablet TAKE 1 TABLET (10 MG TOTAL) BY MOUTH DAILY. 90 tablet 0   No facility-administered medications prior to visit.      EXAM:  There were no vitals taken for this visit.  There is no height or weight on file to calculate BMI.  GENERAL: vitals reviewed and listed above, alert, oriented, appears well hydrated and in no acute distress HEENT: atraumatic, conjunctiva  clear, no obvious abnormalities on inspection of external nose and ears OP : no lesion edema or exudate  NECK: no obvious masses on inspection palpation  LUNGS: clear to auscultation bilaterally, no wheezes, rales or rhonchi, good air movement CV: HRRR, no clubbing cyanosis or  peripheral edema nl cap refill  MS: moves all extremities without noticeable focal  abnormality PSYCH: pleasant and cooperative, no obvious depression or anxiety Lab Results  Component Value  Date   WBC 8.4 11/10/2015   HGB 13.8 11/10/2015   HCT 42.0 11/10/2015   PLT 234.0 11/10/2015   GLUCOSE 180 (H) 12/25/2015   CHOL 185 08/29/2014   TRIG 70.0 08/29/2014   HDL 49.50 08/29/2014   LDLCALC 122 (H) 08/29/2014   ALT 17 11/10/2015   AST 14 11/10/2015   NA 140 12/25/2015   K 3.6 12/25/2015   CL 101 12/25/2015   CREATININE 0.87 12/25/2015   BUN 15 12/25/2015   CO2 29 12/25/2015   TSH 0.92 11/10/2015   HGBA1C 7.2 (H) 11/10/2015   MICROALBUR 0.92 10/21/2012   Wt Readings from Last 3 Encounters:  11/10/15 180 lb (81.6 kg)  02/22/15 182 lb (82.6 kg)  01/25/15 182 lb (82.6 kg)       ASSESSMENT AND PLAN:  Discussed the following assessment and plan:  No diagnosis found. Hg a1c   Foot exam  immuniz -Patient advised to return or notify health care team  if symptoms worsen ,persist or new concerns arise.  There are no Patient Instructions on file for  this visit.   Neta MendsWanda K. Panosh M.D.

## 2016-04-09 ENCOUNTER — Ambulatory Visit: Payer: 59 | Admitting: Internal Medicine

## 2016-04-22 ENCOUNTER — Ambulatory Visit (INDEPENDENT_AMBULATORY_CARE_PROVIDER_SITE_OTHER): Payer: 59 | Admitting: Internal Medicine

## 2016-04-22 ENCOUNTER — Encounter: Payer: Self-pay | Admitting: Internal Medicine

## 2016-04-22 VITALS — BP 138/106 | Temp 98.0°F | Wt 179.7 lb

## 2016-04-22 DIAGNOSIS — E785 Hyperlipidemia, unspecified: Secondary | ICD-10-CM | POA: Diagnosis not present

## 2016-04-22 DIAGNOSIS — M25519 Pain in unspecified shoulder: Secondary | ICD-10-CM

## 2016-04-22 DIAGNOSIS — R52 Pain, unspecified: Secondary | ICD-10-CM | POA: Diagnosis not present

## 2016-04-22 DIAGNOSIS — E119 Type 2 diabetes mellitus without complications: Secondary | ICD-10-CM

## 2016-04-22 DIAGNOSIS — I10 Essential (primary) hypertension: Secondary | ICD-10-CM | POA: Diagnosis not present

## 2016-04-22 DIAGNOSIS — F341 Dysthymic disorder: Secondary | ICD-10-CM

## 2016-04-22 LAB — POCT GLYCOSYLATED HEMOGLOBIN (HGB A1C): HEMOGLOBIN A1C: 7.8

## 2016-04-22 MED ORDER — DULOXETINE HCL 30 MG PO CPEP: 30.0000 mg | ORAL_CAPSULE | Freq: Every day | ORAL | 3 refills | Status: DC

## 2016-04-22 NOTE — Patient Instructions (Addendum)
Please see dr Talmage NapBalan about the diabetes   Do the best with the metformin and we can add a med in the short run  If  You cahnge your mind  Can take the Crestor   At a different time if helps you comply with taking it .    Will refer to  dr Katrinka BlazingSmith or an rheumatologist about the pain . That seems MS cause .    Will start  cymbalta instead of celexa  Because he do better with musculoskeletal neurologic causes of pain. Begin with 30 mg once a day after 2-3 weeks increase to 60 mg a day Suggestion get a pillbox and fill weekly to help compliance and adherence. We'll touch base again with the pulmonar sleep doctor about sleep apnea because poor sleep will aggravate pain. And make high blood pressure harder to treat.

## 2016-04-22 NOTE — Progress Notes (Signed)
Pre visit review using our clinic review tool, if applicable. No additional management support is needed unless otherwise documented below in the visit note.  Chief Complaint  Patient presents with  . Follow-up    Is not able to take metformin everyday.  Causes nausea and headaches.    HPI: Karen Foley 50 y.o.  w hld dm supposed to be followed by dr Talmage NapBalan  depression rx   Ht   Seen June to come back in 3-4 months     BP  Taking meds most days Missed yesterday   bp up from pain . ?   130s /90 to ocass 126/83  Pulses range in 80 /93    No se of bp med only misses ocassionally .   HLD :  Not taking.  Non intentional.    Metformin   Se  When takes full dose even spli often 1 per day   OSA  cpap  And saw   Doc and was supposed to see  Another sleep study and had job change   And insurance   Issue.   Stopped citalopram  fpor a few weeks   And forgot to take it .  Uncertain if helped   "I am in pain all the time for years"    Affecting health and ability to follow through Right shoulder and upper body and  Some down legs to the knees.   Not back  No fever   tylenol advil type meds no help      ROS: See pertinent positives and negatives per HPI. No cp sob  Syncope  Numbness burning pain   Past Medical History:  Diagnosis Date  . Anxiety   . Asthma   . Colon polyps   . Depression   . Diabetes mellitus   . Endometriosis    s/p LOA 2003  . GERD (gastroesophageal reflux disease)   . Hx of varicella   . Hyperlipidemia   . Hypertension   . OSA on CPAP   . Scoliosis   . Seizures (HCC)   . Thyroid disease     Family History  Problem Relation Age of Onset  . Hypertension Mother   . Breast cancer Mother [id number]    breast  . Diabetes Mother   . Prostate cancer Father     prostate  . Diabetes Father   . Hypertension Father   . Goiter Maternal Grandmother   . Diabetes Maternal Grandmother   . Diabetes Maternal Grandfather   . Diabetes Paternal Grandmother     Social  History   Social History  . Marital status: Single    Spouse name: N/A  . Number of children: N/A  . Years of education: N/A   Social History Main Topics  . Smoking status: Never Smoker  . Smokeless tobacco: Never Used  . Alcohol use No  . Drug use: No  . Sexual activity: No   Other Topics Concern  . 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 long busy schedule    No pets    Sleep 4-6 hours    negtad ocass caffiene   No ets fa   G0P0    Outpatient Medications Prior to Visit  Medication Sig Dispense Refill  . losartan-hydrochlorothiazide (HYZAAR) 100-25 MG tablet Take 1 tablet by mouth daily. 30 tablet  11  . metFORMIN (GLUCOPHAGE-XR) 500 MG 24 hr tablet TAKE 2 TABLETS BY MOUTH DAILY WITH BREAKFAST. 60 tablet 0  . ONE TOUCH ULTRA TEST test strip TEST DAILY AS DIRECTED 100 each 5  . ONETOUCH DELICA LANCETS FINE MISC Test once daily 100 each 5  . polyethylene glycol powder (GLYCOLAX/MIRALAX) powder Take 17 g by mouth daily. 3350 g 1  . rosuvastatin (CRESTOR) 10 MG tablet TAKE 1 TABLET (10 MG TOTAL) BY MOUTH DAILY. 90 tablet 0  . citalopram (CELEXA) 20 MG tablet TAKE 1 TABLET BY MOUTH EVERY DAY OR AS DIRECTED 30 tablet 5   No facility-administered medications prior to visit.      EXAM:  BP (!) 138/106 (BP Location: Right Arm, Patient Position: Sitting, Cuff Size: Large)   Temp 98 F (36.7 C) (Oral)   Wt 179 lb 11.2 oz (81.5 kg)   BMI 29.90 kg/m   Body mass index is 29.9 kg/m.  GENERAL: vitals reviewed and listed above, alert, oriented, appears well hydrated and in no acute distressAppears slightly depressed affect normal speech and thought. HEENT: atraumatic, conjunctiva  clear, no obvious abnormalities on inspection of external nose and ears   NECK: no obvious masses on inspection palpation  LUNGS: clear to auscultation bilaterally, no wheezes, rales or  rhonchi, good air movement CV: HRRR, no clubbing cyanosis or  peripheral edema nl cap refill  MS: moves all extremities without noticeable focal  Abnormality no obvious acute joint swelling or gait is within normal.  Points to shoulder  rom limited  PSYCH: pleasant and cooperative, slightly down affect. Lab Results  Component Value Date   WBC 8.4 11/10/2015   HGB 13.8 11/10/2015   HCT 42.0 11/10/2015   PLT 234.0 11/10/2015   GLUCOSE 180 (H) 12/25/2015   CHOL 185 08/29/2014   TRIG 70.0 08/29/2014   HDL 49.50 08/29/2014   LDLCALC 122 (H) 08/29/2014   ALT 17 11/10/2015   AST 14 11/10/2015   NA 140 12/25/2015   K 3.6 12/25/2015   CL 101 12/25/2015   CREATININE 0.87 12/25/2015   BUN 15 12/25/2015   CO2 29 12/25/2015   TSH 0.92 11/10/2015   HGBA1C 7.8 04/22/2016   MICROALBUR 0.92 10/21/2012   BP Readings from Last 3 Encounters:  04/22/16 (!) 138/106  11/10/15 138/84  02/22/15 (!) 148/100   Wt Readings from Last 3 Encounters:  04/22/16 179 lb 11.2 oz (81.5 kg)  11/10/15 180 lb (81.6 kg)  02/22/15 182 lb (82.6 kg)    ASSESSMENT AND PLAN:  Discussed the following assessment and plan:  Diabetes mellitus without complication (HCC) - Plan: POCT A1C  Essential hypertension  Hyperlipidemia, unspecified hyperlipidemia type - take med no compliant recenetly   Pain - add cybalta  - Plan: Ambulatory referral to Sports Medicine  Dysthymia - trial cymbalta   Arthralgia of shoulder, unspecified laterality - Plan: Ambulatory referral to Sports Medicine   Was due to be back on Crestor but never took it.  Appears that her multiple disease states and conditions are not being adequately addressed because if it's too mentally hard for her she doesn't follow through on some days. She denies a specific side effect of medicine. Except for metformin which she states causes nausea and headaches.  Blood pressure also up some today   She hasn't gone to follow through getting appointment  with Dr. Talmage Nap for whatever reason. She states that she will do this.  Has roadblock insurance coverage for the osa  eval .  We reviewed other medications she's not interested in this at this time and would like to see the endocrinologist first. Having some side effects of metformin.  She states that she has had pain affecting her compliance for the last year or 2. Uncertain to me if it is a depressive syndrome localized arthritic joint pain with right shoulder upper body predominant or other rheumatologic state. Apparently it is in the way of her completing her health plan.  She will get an appointment with Dr. Talmage Napbalan prioritized discussed pillbox make it easier to comply with medication. At this time does not want to be put on additional medicine for her blood sugar.  Approach to her "pain". Medication may not be the answer as it has worked by now. Possible physical modalities depending on cause felt. Options discussed will refer consult to Dr. Katrinka BlazingSmith in regard to her right upper shoulder trapezius arm difficulties. Any other input would be  Welcomed.  Because of her pain symptoms will try Cymbalta instead of Celexa for possible results. Are OV in 1 month. In the meantime referral to sports medicine Dr. West CarboSmitherman about her specific musculoskeletal pains.  Wellness PV in June 18 Total visit 40mins > 50% spent counseling and coordinating care as indicated in above note and in instructions to patient .     -Patient advised to return or notify health care team  if  new concerns arise.  Patient Instructions  Please see dr Talmage NapBalan about the diabetes   Do the best with the metformin and we can add a med in the short run  If  You cahnge your mind  Can take the Crestor   At a different time if helps you comply with taking it .    Will refer to  dr Katrinka BlazingSmith or an rheumatologist about the pain . That seems MS cause .    Will start  cymbalta instead of celexa  Because he do better with musculoskeletal  neurologic causes of pain. Begin with 30 mg once a day after 2-3 weeks increase to 60 mg a day Suggestion get a pillbox and fill weekly to help compliance and adherence. We'll touch base again with the pulmonar sleep doctor about sleep apnea because poor sleep will aggravate pain. And make high blood pressure harder to treat.       Neta MendsWanda K. Tyreek Clabo M.D.

## 2016-04-23 NOTE — Progress Notes (Deleted)
Tawana ScaleZach Jonathin Heinicke D.O. [facility] [address] [place], [place] [address] Phone: [phone]) [phone] Subjective:    I'm seeing this patient by the request  of:  Lorretta HarpPANOSH,WANDA KOTVAN, MD   CC: Shoulder pain   BJY:NWGNFAOZHYHPI:Subjective  Karen SinghCarol A Foley is a 50 y.o. female coming in with complaint of right shoulder pain     Past Medical History:  Diagnosis Date  . Anxiety   . Asthma   . Colon polyps   . Depression   . Diabetes mellitus   . Endometriosis    s/p LOA 2003  . GERD (gastroesophageal reflux disease)   . Hx of varicella   . Hyperlipidemia   . Hypertension   . OSA on CPAP   . Scoliosis   . Seizures (HCC)   . Thyroid disease    Past Surgical History:  Procedure Laterality Date  . ABDOMINAL HYSTERECTOMY  2003   TAH BSO endometriosis  . BREAST BIOPSY  2008  . laproscopy  [phone],2003,2011  . MASTOIDECTOMY Right 2011  . MASTOIDECTOMY  2011  . OVARIAN CYST REMOVAL  2011   Social History   Social History  . Marital status: Single    Spouse name: N/A  . Number of children: N/A  . Years of education: N/A   Social History Main Topics  . Smoking status: Never Smoker  . Smokeless tobacco: Never Used  . Alcohol use No  . Drug use: No  . Sexual activity: No   Other Topics Concern  . 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 long busy schedule    No pets    Sleep 4-6 hours    negtad ocass caffiene   No ets fa   G0P0   Allergies  Allergen Reactions  . Clindamycin/Lincomycin Hives and Itching   Family History  Problem Relation Age of Onset  . Hypertension Mother   . Breast cancer Mother [id number]    breast  . Diabetes Mother   . Prostate cancer Father     prostate  . Diabetes Father   . Hypertension Father   . Goiter Maternal Grandmother   . Diabetes Maternal Grandmother   . Diabetes Maternal Grandfather   . Diabetes Paternal Grandmother      Past medical history, social, surgical and family history all reviewed in electronic medical record.  No pertanent information unless stated regarding to the chief complaint.   Review of Systems:Review of systems updated and as accurate as of 04/23/16  No headache, visual changes, nausea, vomiting, diarrhea, constipation, dizziness, abdominal pain, skin rash, fevers, chills, night sweats, weight loss, swollen lymph nodes, body aches, joint swelling, muscle aches, chest pain, shortness of breath, mood changes.   Objective  There were no vitals taken for this visit. Systems examined below as of 04/23/16   General: No apparent distress alert and oriented x3 mood and affect normal, dressed appropriately.  HEENT: Pupils equal, extraocular movements intact  Respiratory: Patient's speak in full sentences and does not appear short of breath  Cardiovascular: No lower extremity edema, non tender, no erythema  Skin: Warm dry intact with no signs of infection or rash on extremities or on axial skeleton.  Abdomen: Soft nontender  Neuro: Cranial nerves II through XII are intact, neurovascularly intact in all extremities with 2+ DTRs and 2+ pulses.  Lymph: No lymphadenopathy  of posterior or anterior cervical chain or axillae bilaterally.  Gait normal with good balance and coordination.  MSK:  Non tender with full range of motion and good stability and symmetric strength and tone of elbows, wrist, hip, knee and ankles bilaterally.  Shoulder: Right Inspection reveals no abnormalities, atrophy or asymmetry. Palpation is normal with no tenderness over AC joint or bicipital groove. ROM is full in all planes passively. Rotator cuff strength normal throughout. signs of impingement with positive Neer and Hawkin's tests, but negative empty can sign. Speeds and Yergason's tests normal. No labral pathology noted with negative Obrien's, negative clunk and good stability. Normal scapular function observed. No  painful arc and no drop arm sign. No apprehension sign  MSK US performed of: Right This study was ordered, performed, and interpreted by Terrilee FilesZach Acadia Thammavong D.O.  Shoulder:   Supraspinatus:  Appears normal on long and transverse views, Bursal bulge seen with shoulder abduction on impingement view. Infraspinatus:  Appears normal on long and transverse views. Significant increase in Doppler flow Subscapularis:  Appears normal on long and transverse views. Positive bursa Teres Minor:  Appears normal on long and transverse views. AC joint:  Capsule undistended, no geyser sign. Glenohumeral Joint:  Appears normal without effusion. Glenoid Labrum:  Intact without visualized tears. Biceps Tendon:  Appears normal on long and transverse views, no fraying of tendon, tendon located in intertubercular groove, no subluxation with shoulder internal or external rotation.  Impression: Subacromial bursitis  Procedure: Real-time Ultrasound Guided Injection of right glenohumeral joint Device: GE Logiq E  Ultrasound guided injection is preferred based studies that show increased duration, increased effect, greater accuracy, decreased procedural pain, increased response rate with ultrasound guided versus blind injection.  Verbal informed consent obtained.  Time-out conducted.  Noted no overlying erythema, induration, or other signs of local infection.  Skin prepped in a sterile fashion.  Local anesthesia: Topical Ethyl chloride.  With sterile technique and under real time ultrasound guidance:  Joint visualized.  23g 1  inch needle inserted posterior approach. Pictures taken for needle placement. Patient did have injection of 2 cc of 1% lidocaine, 2 cc of 0.5% Marcaine, and 1.0 cc of Kenalog 40 mg/dL. Completed without difficulty  Pain immediately resolved suggesting accurate placement of the medication.  Advised to call if fevers/chills, erythema, induration, drainage, or persistent bleeding.  Images permanently  stored and available for review in the ultrasound unit.  Impression: Technically successful ultrasound guided injection.    Impression and Recommendations:     This case required medical decision making of moderate complexity.      Note: This dictation was prepared with Dragon dictation along with smaller phrase technology. Any transcriptional errors that result from this process are unintentional.

## 2016-04-24 ENCOUNTER — Ambulatory Visit: Payer: 59 | Admitting: Family Medicine

## 2016-05-01 NOTE — Progress Notes (Signed)
Tawana ScaleZach Insiya Oshea D.O. [facility] [address] [place], [place] [address] Phone: [phone]) [phone] Subjective:    I'm seeing this patient by the request  of:  Lorretta HarpPANOSH,WANDA KOTVAN, MD   CC: Shoulder pain Bilateral aches and pain everywhere.   FAO:ZHYQMVHQIOHPI:Subjective  Karen Foley is a 50 y.o. female coming in with complaint of right shoulder pain. Patient states that she has chronic pain everywhere though. Patient states recently though the shoulder seems to be the worse. Patient did see primary care provider and was recently started on Cymbalta. Patient states that this has been helping. Unfortunately patient continues to have pain everywhere she states. Lower back, upper back, mid back. Patient denies any radiation into the extremities. Denies any weakness. Still able to do daily activities but is finding it more difficult. Feels like she is continuing as well. States that this is been going on multiple months of years. Sometimes can feel better and sometimes worse. Does not know what seems to exacerbate it.      Past Medical History:  Diagnosis Date  . Anxiety   . Asthma   . Colon polyps   . Depression   . Diabetes mellitus   . Endometriosis    s/p LOA 2003  . GERD (gastroesophageal reflux disease)   . Hx of varicella   . Hyperlipidemia   . Hypertension   . OSA on CPAP   . Scoliosis   . Seizures (HCC)   . Thyroid disease    Past Surgical History:  Procedure Laterality Date  . ABDOMINAL HYSTERECTOMY  2003   TAH BSO endometriosis  . BREAST BIOPSY  2008  . laproscopy  [phone],2003,2011  . MASTOIDECTOMY Right 2011  . MASTOIDECTOMY  2011  . OVARIAN CYST REMOVAL  2011   Social History   Social History  . Marital status: Single    Spouse name: N/A  . Number of children: N/A  . Years of education: N/A   Social History Main Topics  . Smoking status: Never Smoker  . Smokeless tobacco: Never Used  . Alcohol use No  . Drug use: No  . Sexual activity: No   Other Topics  Concern  . 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 long busy schedule    No pets    Sleep 4-6 hours    negtad ocass caffiene   No ets fa   G0P0   Allergies  Allergen Reactions  . Clindamycin/Lincomycin Hives and Itching   Family History  Problem Relation Age of Onset  . Hypertension Mother   . Breast cancer Mother [phone]    breast  . Diabetes Mother   . Prostate cancer Father     prostate  . Diabetes Father   . Hypertension Father   . Goiter Maternal Grandmother   . Diabetes Maternal Grandmother   . Diabetes Maternal Grandfather   . Diabetes Paternal Grandmother     Past medical history, social, surgical and family history all reviewed in electronic medical record.  No pertanent information unless stated regarding to the chief complaint.   Review of Systems:Review of systems updated and as accurate as of 05/02/16  No headache, visual changes, nausea, vomiting, diarrhea, constipation, dizziness, abdominal pain, skin rash, fevers, chills, night sweats, weight loss, swollen lymph nodes, body aches, joint swelling, muscle aches, chest pain, shortness of breath,  mood changes.   Objective  Blood pressure (!) 144/90, pulse 92, height 5\' 3"  (1.6 m), weight 179 lb (81.2 kg), SpO2 97 %. Systems examined below as of 05/02/16   General: No apparent distress alert and oriented x3 mood and affect normal, dressed appropriately.  HEENT: Pupils equal, extraocular movements intact  Respiratory: Patient's speak in full sentences and does not appear short of breath  Cardiovascular: No lower extremity edema, non tender, no erythema  Skin: Warm dry intact with no signs of infection or rash on extremities or on axial skeleton.  Abdomen: Soft nontender  Neuro: Cranial nerves II through XII are intact, neurovascularly intact in all extremities with 2+ DTRs and 2+ pulses.  Lymph: No  lymphadenopathy of posterior or anterior cervical chain or axillae bilaterally.  Gait normal with good balance and coordination.  MSK:  Tender mostly of the musculature around the joints with full range of motion and good stability and symmetric strength and tone of, elbows, wrist, hip, knee and ankles bilaterally.  Neck: Inspection unremarkable. No palpable stepoffs. Negative Spurling's maneuver. Full neck range of motion Grip strength and sensation normal in bilateral hands Strength good C4 to T1 distribution No sensory change to C4 to T1 Negative Hoffman sign bilaterally Reflexes normal Shoulder: Bilateral Inspection reveals mild retraction of the shoulders. Palpation is normal with no tenderness over AC joint or bicipital groove. ROM is full in all planes. Rotator cuff strength normal throughout. Very minimal impingement Speeds and Yergason's tests normal. No labral pathology noted with negative Obrien's, negative clunk and good stability. Normal scapular function observed. No painful arc and no drop arm sign. No apprehension sign      Impression and Recommendations:     This case required medical decision making of moderate complexity.      Note: This dictation was prepared with Dragon dictation along with smaller phrase technology. Any transcriptional errors that result from this process are unintentional.

## 2016-05-02 ENCOUNTER — Ambulatory Visit (INDEPENDENT_AMBULATORY_CARE_PROVIDER_SITE_OTHER): Payer: 59 | Admitting: Family Medicine

## 2016-05-02 ENCOUNTER — Encounter: Payer: Self-pay | Admitting: Family Medicine

## 2016-05-02 ENCOUNTER — Other Ambulatory Visit (INDEPENDENT_AMBULATORY_CARE_PROVIDER_SITE_OTHER): Payer: 59

## 2016-05-02 VITALS — BP 144/90 | HR 92 | Ht 63.0 in | Wt 179.0 lb

## 2016-05-02 DIAGNOSIS — M2550 Pain in unspecified joint: Secondary | ICD-10-CM | POA: Insufficient documentation

## 2016-05-02 LAB — SEDIMENTATION RATE: SED RATE: 39 mm/h — AB (ref 0–30)

## 2016-05-02 LAB — VITAMIN D 25 HYDROXY (VIT D DEFICIENCY, FRACTURES): VITD: 13.35 ng/mL — AB (ref 30.00–100.00)

## 2016-05-02 LAB — FERRITIN: FERRITIN: 158 ng/mL (ref 10.0–291.0)

## 2016-05-02 LAB — C-REACTIVE PROTEIN: CRP: 0.4 mg/dL — ABNORMAL LOW (ref 0.5–20.0)

## 2016-05-02 MED ORDER — VITAMIN D (ERGOCALCIFEROL) 1.25 MG (50000 UNIT) PO CAPS: 50000.0000 [IU] | ORAL_CAPSULE | ORAL | 0 refills | Status: DC

## 2016-05-02 NOTE — Progress Notes (Unsigned)
Awaiting some other labs but vitamin D was very low at 14.  Needs to be near 50! The once weekly vitamin D will make a difference.  I will write you when I have the rest of the labs.

## 2016-05-02 NOTE — Assessment & Plan Note (Signed)
Patient is having signs and symptoms were consistent with a polyarthralgia. Differential also includes a chronic pain syndrome, fibromyalgia, or autoimmune disease. Laboratory workup ordered today for further evaluation and see if there is any other systemic causes to most of her discomfort. Patient is not taking the second on a regular basis. Has had trouble with vitamin D previously and given a once weekly. Patient encouraged to continue the Cymbalta that seems to be making a difference. We discussed with patient about the possibility of osteopathic manipulation but we will hold until follow-up. We will see on patient response to conservative therapy. Follow-up again in 3-4 weeks.

## 2016-05-02 NOTE — Patient Instructions (Addendum)
Good to see you.  Get labs downstairs Ice 20 minutes 2 times daily. Usually after activity and before bed. pennsaid pinkie amount topically 2 times daily as needed.  Once weekly vitamin D for next 12 weeks.  I love the cymbalta and lets continue the same regimen.  Turmeric 500mg  twice daily can help a lot.  Exercises 3 times a week.  Stay active Happy holidays!  See me again in 3-4 weeks.

## 2016-05-03 LAB — ANTI-DNA ANTIBODY, DOUBLE-STRANDED

## 2016-05-03 LAB — CYCLIC CITRUL PEPTIDE ANTIBODY, IGG

## 2016-05-03 LAB — ANTI-NUCLEAR AB-TITER (ANA TITER): ANA Titer 1: 1:80 {titer} — ABNORMAL HIGH

## 2016-05-03 LAB — RHEUMATOID FACTOR

## 2016-05-03 LAB — ANA: ANA: POSITIVE — AB

## 2016-05-03 LAB — ANGIOTENSIN CONVERTING ENZYME: ANGIOTENSIN-CONVERTING ENZYME: 30 U/L (ref 9–67)

## 2016-05-11 ENCOUNTER — Other Ambulatory Visit: Payer: Self-pay | Admitting: Internal Medicine

## 2016-05-14 NOTE — Telephone Encounter (Signed)
Denied.  Pt is no longer taking.  See below from last OV.  Patient Instructions  Please see dr Talmage NapBalan about the diabetes   Do the best with the metformin and we can add a med in the short run  If  You cahnge your mind  Can take the Crestor   At a different time if helps you comply with taking it .    Will refer to  dr Katrinka BlazingSmith or an rheumatologist about the pain . That seems MS cause .    Will start  cymbalta instead of celexa  Because he do better with musculoskeletal neurologic causes of pain. Begin with 30 mg once a day after 2-3 weeks increase to 60 mg a day Suggestion get a pillbox and fill weekly to help compliance and adherence. We'll touch base again with the pulmonar sleep doctor about sleep apnea because poor sleep will aggravate pain. And make high blood pressure harder to treat.

## 2016-05-28 NOTE — Progress Notes (Signed)
Karen Foley [address] [place], [place] [address] Phone: [phone] Subjective:    I'm seeing this patient by the request  of:  Lorretta Harp, MD   CC: Polyarthralgia follow-up  BJY:NWGNFAOZHY  Karen Foley is a 51 y.o. female coming in with complaint of multiple muscular skeletal complaints. Patient was returned to the laboratory for workup. Patient was to have a severely low vitamin D as well as a positive ANA with a 1:80 titer. Patient states that overall though since she's been doing the once weekly vitamin D and over-the-counter medication she has noticed some improvement of 20-30%. Not as severe pain. Patient states that she has also had a little more energy. Sleeping better as well. Patient still states though that the pain can be enough that stops her from certain activities. Patient states that she has noticed certain family event secondary to pain.       Past Medical History:  Diagnosis Date  . Anxiety   . Asthma   . Colon polyps   . Depression   . Diabetes mellitus   . Endometriosis    s/p LOA 2003  . GERD (gastroesophageal reflux disease)   . Hx of varicella   . Hyperlipidemia   . Hypertension   . OSA on CPAP   . Scoliosis   . Seizures (HCC)   . Thyroid disease    Past Surgical History:  Procedure Laterality Date  . ABDOMINAL HYSTERECTOMY  2003   TAH BSO endometriosis  . BREAST BIOPSY  2008  . laproscopy  [id number]  . MASTOIDECTOMY Right 2011  . MASTOIDECTOMY  2011  . OVARIAN CYST REMOVAL  2011   Social History   Social History  . Marital status: Single    Spouse name: N/A  . Number of children: N/A  . Years of education: N/A   Social History Main Topics  . Smoking status: Never Smoker  . Smokeless tobacco: Never Used  . Alcohol use No  . Drug use: No  . Sexual activity: No   Other Topics Concern  . 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 Clindamycin/Lincomycin Hives and Itching   Family History  Problem Relation Age of Onset  . Hypertension Mother   . Breast cancer Mother 43    breast  . Diabetes Mother   . Prostate cancer Father     prostate  . Diabetes Father   . Hypertension Father   . Goiter Maternal Grandmother   . Diabetes Maternal Grandmother   . Diabetes Maternal Grandfather   . Diabetes Paternal Grandmother     Past medical history, social, surgical and family history all reviewed in electronic medical record.  No pertanent information unless stated regarding to the chief complaint.   Review of Systems: No headache, visual changes, nausea, vomiting, diarrhea, constipation, dizziness, abdominal pain, skin rash, fevers, chills, night sweats, weight loss, swollen lymph nodes, Positive for muscle aches, joint swelling intermittently.    Objective  Blood pressure 130/82, pulse 70, height 5\' 3"  (1.6 m), weight 172 lb (78 kg), SpO2 98 %. Systems examined below as of 05/29/16  Systems examined below as of 05/29/16 General: NAD A&O x3 mood, affect normal  HEENT: Pupils equal, extraocular movements intact no nystagmus Respiratory: not short of breath at rest or with speaking Cardiovascular: No lower extremity edema, non tender Skin: Warm dry intact with no signs of infection or rash on extremities or on axial skeleton. Abdomen: Soft nontender, no masses Neuro: Cranial nerves  intact, neurovascularly intact in all extremities with 2+ DTRs and 2+ pulses. Lymph: No lymphadenopathy appreciated today  Gait normal with good balance and coordination.  MSK: Non tender with full range of motion and good stability and symmetric strength and tone of shoulders, elbows, wrist,  knee hips and ankles bilaterally.   Neck: Inspection  unremarkable. No palpable stepoffs. Negative Spurling's maneuver. Full neck range of motion Grip strength and sensation normal in bilateral hands Strength good C4 to T1 distribution No sensory change to C4 to T1 Negative Hoffman sign bilaterally Reflexes normal patient is tender to palpation and appears palmar musculature of the trapezius is bilaterally  Osteopathic findings Cervical C4 flexed rotated and side bent right C5 flexed rotated and side bent left T3 extended rotated and side bent right inhaled third rib T10 extended rotated and side bent left L3 flexed rotated and side bent right Sacrum right on right      Impression and Recommendations:     This case required medical decision making of moderate complexity.      Note: This dictation was prepared with Dragon dictation along with smaller phrase technology. Any transcriptional errors that result from this process are unintentional.

## 2016-05-29 ENCOUNTER — Encounter: Payer: Self-pay | Admitting: Family Medicine

## 2016-05-29 ENCOUNTER — Ambulatory Visit (INDEPENDENT_AMBULATORY_CARE_PROVIDER_SITE_OTHER): Payer: 59 | Admitting: Family Medicine

## 2016-05-29 VITALS — BP 130/82 | HR 70 | Ht 63.0 in | Wt 172.0 lb

## 2016-05-29 DIAGNOSIS — R768 Other specified abnormal immunological findings in serum: Secondary | ICD-10-CM

## 2016-05-29 DIAGNOSIS — M2550 Pain in unspecified joint: Secondary | ICD-10-CM | POA: Diagnosis not present

## 2016-05-29 DIAGNOSIS — M999 Biomechanical lesion, unspecified: Secondary | ICD-10-CM | POA: Diagnosis not present

## 2016-05-29 MED ORDER — DICLOFENAC SODIUM 2 % TD SOLN: TRANSDERMAL | 3 refills | Status: DC

## 2016-05-29 NOTE — Assessment & Plan Note (Signed)
Patient is doing relatively well at this time. We did attempt osteopathic manipulation with some mild improvement. We discussed icing regimen. Patient is still concerned with the aches and pains and would like to see a rheumatologist for the positive ANA. Patient given different exercises to work on posture. Continue all other medications. Follow-up again in 4-6 weeks.

## 2016-05-29 NOTE — Assessment & Plan Note (Signed)
Decision today to treat with OMT was based on Physical Exam  After verbal consent patient was treated with HVLA, ME, FPR techniques in cervical, thoracic, rib lumbar and sacral areas  Patient tolerated the procedure well with improvement in symptoms  Patient given exercises, stretches and lifestyle modifications  See medications in patient instructions if given  Patient will follow up in 4-6 weeks 

## 2016-05-29 NOTE — Patient Instructions (Signed)
Good We will get you in with rheumatology to see what they think.  They should call you  We tried manipulation today and I hope it helps.  ICe is your friend.  Stay active.  As long as you do well see me again in 3-4 weeks.

## 2016-06-03 ENCOUNTER — Other Ambulatory Visit: Payer: Self-pay | Admitting: Family Medicine

## 2016-06-03 MED ORDER — DULOXETINE HCL 30 MG PO CPEP: 30.0000 mg | ORAL_CAPSULE | Freq: Every day | ORAL | 0 refills | Status: AC

## 2016-06-03 NOTE — Telephone Encounter (Signed)
Received a request to fill for 90 day supply.

## 2016-06-04 ENCOUNTER — Encounter: Payer: Self-pay | Admitting: Internal Medicine

## 2016-06-04 ENCOUNTER — Ambulatory Visit (INDEPENDENT_AMBULATORY_CARE_PROVIDER_SITE_OTHER): Payer: 59 | Admitting: Internal Medicine

## 2016-06-04 VITALS — BP 114/80 | Temp 97.8°F | Wt 173.0 lb

## 2016-06-04 DIAGNOSIS — J029 Acute pharyngitis, unspecified: Secondary | ICD-10-CM

## 2016-06-04 DIAGNOSIS — F341 Dysthymic disorder: Secondary | ICD-10-CM

## 2016-06-04 DIAGNOSIS — I10 Essential (primary) hypertension: Secondary | ICD-10-CM

## 2016-06-04 DIAGNOSIS — Z20818 Contact with and (suspected) exposure to other bacterial communicable diseases: Secondary | ICD-10-CM | POA: Diagnosis not present

## 2016-06-04 DIAGNOSIS — E119 Type 2 diabetes mellitus without complications: Secondary | ICD-10-CM

## 2016-06-04 DIAGNOSIS — Z79899 Other long term (current) drug therapy: Secondary | ICD-10-CM

## 2016-06-04 LAB — POCT RAPID STREP A (OFFICE): Rapid Strep A Screen: NEGATIVE

## 2016-06-04 NOTE — Patient Instructions (Addendum)
   Your blood pressure is good continue as you are going  Continue Weight Watchers and healthy weight loss and this will help your diabetes and high blood pressure.  It is too early to do the A1c test that we should do a full panel of blood work and check up in late March or April  Continue medication adherence as you were doing good job.  Stay on same medication for now   Contact us if you feel you need to change a medication .    Agree follow through with the sleep apnea evaluation this year it could help you feel better.  Wt Readings from Last 3 Encounters:  05/29/16 172 lb (78 kg)  05/02/16 179 lb (81.2 kg)  04/22/16 179 lb 11.2 oz (81.5 kg)   Most  throat infections are viral and have to run the course. We'll let you know when throat culture results are back.

## 2016-06-04 NOTE — Progress Notes (Signed)
Chief Complaint  Patient presents with  . Follow-up    HPI:  Karen Foley 51 y.o.  follow-up the number of problems since her last visit she's been on Cymbalta uncertain is helping her.  To go  To weight watcher   No checking   bg     No alamring . Sx  Readings    Pain issues shoudler  Some better seeing dr Katrinka BlazingSMith. She has a positive serologies and will be seeing a rheumatologist also about her musculoskeletal symptoms.  Taking meds  Has a pill organizer  so taking her blood pressure medicine.  She is going to get her sleep checked out this year.  Sees progress in trying to lose weight.  Today she is having difficulty with upper respiratory symptoms that include a sore throat for a few days some postnasal drainage and severe body aches. No associated fever there is a potential exposure from coworkers child with strep throat.  No shortness of breath cough seems to be minor. ROS: See pertinent positives and negatives per HPI. No chest pain shortness of breath still working on the pain in the shoulder issue. Can awaken her from sleep.  Past Medical History:  Diagnosis Date  . Anxiety   . Asthma   . Colon polyps   . Depression   . Diabetes mellitus   . Endometriosis    s/p LOA 2003  . GERD (gastroesophageal reflux disease)   . Hx of varicella   . Hyperlipidemia   . Hypertension   . OSA on CPAP   . Scoliosis   . Seizures (HCC)   . Thyroid disease     Family History  Problem Relation Age of Onset  . Hypertension Mother   . Breast cancer Mother [id number]    breast  . Diabetes Mother   . Prostate cancer Father     prostate  . Diabetes Father   . Hypertension Father   . Goiter Maternal Grandmother   . Diabetes Maternal Grandmother   . Diabetes Maternal Grandfather   . Diabetes Paternal Grandmother     Social History   Social History  . Marital status: Single    Spouse name: N/A  . Number of children: N/A  . Years of education: N/A   Social History Main  Topics  . Smoking status: Never Smoker  . Smokeless tobacco: Never Used  . Alcohol use No  . Drug use: No  . Sexual activity: No   Other Topics Concern  . 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 long busy schedule    No pets    Sleep 4-6 hours    negtad ocass caffiene   No ets fa   G0P0    Outpatient Medications Prior to Visit  Medication Sig Dispense Refill  . Diclofenac Sodium (PENNSAID) 2 % SOLN Apply 1 pump twice daily as needed. 112 g 3  . DULoxetine (CYMBALTA) 30 MG capsule Take 1 capsule (30 mg total) by mouth daily. Increase to  60 mg per day after 2 weeks (Patient taking differently: Take 60 mg by mouth daily. ) 180 capsule 0  . losartan-hydrochlorothiazide (HYZAAR) 100-25 MG tablet Take 1 tablet by mouth daily. 30 tablet 11  . metFORMIN (GLUCOPHAGE-XR) 500 MG 24 hr tablet TAKE 2 TABLETS BY MOUTH DAILY WITH BREAKFAST. 60 tablet 0  .  ONE TOUCH ULTRA TEST test strip TEST DAILY AS DIRECTED 100 each 5  . ONETOUCH DELICA LANCETS FINE MISC Test once daily 100 each 5  . polyethylene glycol powder (GLYCOLAX/MIRALAX) powder Take 17 g by mouth daily. 3350 g 1  . rosuvastatin (CRESTOR) 10 MG tablet TAKE 1 TABLET (10 MG TOTAL) BY MOUTH DAILY. 90 tablet 0  . Turmeric 500 MG TABS Take 1 capsule by mouth 2 (two) times daily.    . Vitamin D, Ergocalciferol, (DRISDOL) 50000 units CAPS capsule Take 1 capsule (50,000 Units total) by mouth every 7 (seven) days. 12 capsule 0   No facility-administered medications prior to visit.      EXAM:  BP 114/80 (BP Location: Right Arm, Patient Position: Sitting, Cuff Size: Large)   Temp 97.8 F (36.6 C) (Oral)   Wt 173 lb (78.5 kg)   BMI 30.65 kg/m   Body mass index is 30.65 kg/m.  GENERAL: vitals reviewed and listed above, alert, oriented, appears well hydrated and in no acute distressMinimal congestion HEENT: atraumatic,  conjunctiva  clear, no obvious abnormalities on inspection of external nose and ears TMs are clear OP : no lesion edema or exudate +2 red no lesions.- NECK: no obvious masses on inspection palpation there is some tenderness in the before meals nodes no PC nodes. LUNGS: clear to auscultation bilaterally, no wheezes, rales or rhonchi, good air movement CV: HRRR, no clubbing cyanosis or  peripheral edema nl cap refill  MS: moves all extremities without noticeable focal  abnormality PSYCH: pleasant and cooperative Lab Results  Component Value Date   WBC 8.4 11/10/2015   HGB 13.8 11/10/2015   HCT 42.0 11/10/2015   PLT 234.0 11/10/2015   GLUCOSE 180 (H) 12/25/2015   CHOL 185 08/29/2014   TRIG 70.0 08/29/2014   HDL 49.50 08/29/2014   LDLCALC 122 (H) 08/29/2014   ALT 17 11/10/2015   AST 14 11/10/2015   NA 140 12/25/2015   K 3.6 12/25/2015   CL 101 12/25/2015   CREATININE 0.87 12/25/2015   BUN 15 12/25/2015   CO2 29 12/25/2015   TSH 0.92 11/10/2015   HGBA1C 7.8 04/22/2016   MICROALBUR 0.92 10/21/2012   Wt Readings from Last 3 Encounters:  06/04/16 173 lb (78.5 kg)  05/29/16 172 lb (78 kg)  05/02/16 179 lb (81.2 kg)    ASSESSMENT AND PLAN:  Discussed the following assessment and plan:  Diabetes mellitus without complication (HCC)  Pharyngitis, unspecified etiology - Plan: POCT rapid strep A, Culture, Group A Strep  Exposure to strep throat - Plan: POCT rapid strep A, Culture, Group A Strep  Essential hypertension  Medication management  Dysthymia Patient prefers to continue to intensify lifestyle intervention (it is too early to get her A1c. )She has been able to lose weight and to continue with this her blood pressure control is much better since she's been able to adhere to taking the medicine with the medicine tracker. Hopefully her musculoskeletal discomfort will improve also. She will remain on her Cymbalta for this time. -Patient advised to return or notify health care  team  if symptoms worsen ,persist or new concerns arise.  Patient Instructions    Your blood pressure is good continue as you are going  Continue Weight Watchers and healthy weight loss and this will help your diabetes and high blood pressure.  It is too early to do the A1c test that we should do a full panel of blood work and check up in late March or April  Continue medication adherence as you were doing good job.  Stay on same medication for now   Contact us if you feel you need to change a medication .    Agree follow through with the sleep apnea evaluation this year it could help you feel better.  Wt Readings from Last 3 Encounters:  05/29/16 172 lb (78 kg)  05/02/16 179 lb (81.2 kg)  04/22/16 179 lb 11.2 oz (81.5 kg)   Most  throat infections are viral and have to run the course. We'll let you know when throat culture results are back.    Neta Mends. Tanisha Lutes M.D.

## 2016-06-06 LAB — CULTURE, GROUP A STREP: ORGANISM ID, BACTERIA: NORMAL

## 2016-06-07 ENCOUNTER — Telehealth: Payer: Self-pay | Admitting: Internal Medicine

## 2016-06-07 NOTE — Telephone Encounter (Signed)
It does sound viral to me, so it will probably go away over the next few days. I suggest adding Ibuprofen 600-800 mg TID for a few days to help her feel better

## 2016-06-07 NOTE — Telephone Encounter (Signed)
See alternate  TE

## 2016-06-07 NOTE — Telephone Encounter (Signed)
[facility] - Client TELEPHONE ADVICE RECORD [facility] Patient Name: Karen Foley Blank DOB: 1966-02-19 Initial Comment caller state she has sore throat, dizziness and ear pain Nurse Assessment Nurse: Debera Latalston, RN, Tinnie GensJeffrey Date/Time Lamount Cohen(Eastern Time): 06/07/2016 1:30:05 PM Confirm and document reason for call. If symptomatic, describe symptoms. ---Caller states she has sore throat, dizziness and ear pain. Saw PCP on Tuesday. Tested for strep. Symptoms started on Sunday. No fever. Does the patient have any new or worsening symptoms? ---Yes Will a triage be completed? ---Yes Related visit to physician within the last 2 weeks? ---Yes Does the PT have any chronic conditions? (i.e. diabetes, asthma, etc.) ---Yes List chronic conditions. ---Diabetes type 2 and HTN Is the patient pregnant or possibly pregnant? (Ask all females between the ages of 3812-55) ---No Is this a behavioral health or substance abuse call? ---No Guidelines Guideline Title Affirmed Question Affirmed Notes Sore Throat [1] Positive throat culture or rapid strep test (according to lab, PCP, caller, etc.) AND [2] NO standing order to call in prescription for antibiotic Final Disposition User Call PCP within 24 Hours Grand RiverRalston, RN, Advanced Micro DevicesJeffrey Comments Spoke with office on backline and they requested that patient be transferred for results of throat culture. Disagree/Comply: Comply

## 2016-06-07 NOTE — Telephone Encounter (Signed)
[facility] transferred call from Karen Foley to office. Spoke with Karen Foley and advised that her throat culture was negative. She c.o continued sore throat and mild non-productive cough. Karen Foley has been taking Delsym, Advil and Chloraseptic lozenges. Advised Karen Foley she can try Tylenol and Chloraseptic throat spray as alternatives. She does report low grade fever earlier in the week but none currently.   Dr Fabian SharpPanosh is out of office.   Dr Clent RidgesFry - Please advise. Thanks!

## 2016-06-07 NOTE — Telephone Encounter (Signed)
Left a message for a return call on number listed.

## 2016-06-07 NOTE — Telephone Encounter (Signed)
Patient Name: Karen Foley  DOB: 06-Feb-1966    Initial Comment Caller's throat is sore, is dizzy, ears are hurting.    Nurse Assessment      Guidelines    Guideline Title Affirmed Question Affirmed Notes       Final Disposition User   FINAL ATTEMPT MADE - message left Stefano GaulStringer, Charity fundraiserN, Dwana CurdVera

## 2016-06-10 ENCOUNTER — Encounter: Payer: Self-pay | Admitting: Family Medicine

## 2016-06-10 NOTE — Telephone Encounter (Signed)
Left a message for a return call.

## 2016-06-11 NOTE — Telephone Encounter (Signed)
Tried to return phone call. Received voicemail.  Left a message stating that I hope she is feeling better and to call if needed.  Several attempts have been made to reach the pt.  Will now close the note.

## 2016-06-21 ENCOUNTER — Other Ambulatory Visit: Payer: Self-pay | Admitting: Internal Medicine

## 2016-06-24 ENCOUNTER — Other Ambulatory Visit: Payer: Self-pay | Admitting: Family Medicine

## 2016-06-24 DIAGNOSIS — E118 Type 2 diabetes mellitus with unspecified complications: Secondary | ICD-10-CM

## 2016-06-24 DIAGNOSIS — Z0000 Encounter for general adult medical examination without abnormal findings: Secondary | ICD-10-CM

## 2016-06-24 NOTE — Telephone Encounter (Signed)
Pt scheduled for cpx on 08/13/16.  Sent in for 90 days.

## 2016-06-28 ENCOUNTER — Encounter: Payer: Self-pay | Admitting: *Deleted

## 2016-06-28 ENCOUNTER — Ambulatory Visit (INDEPENDENT_AMBULATORY_CARE_PROVIDER_SITE_OTHER): Payer: 59 | Admitting: Family Medicine

## 2016-06-28 ENCOUNTER — Encounter: Payer: Self-pay | Admitting: Family Medicine

## 2016-06-28 VITALS — BP 164/104 | HR 74 | Ht 63.0 in | Wt 172.0 lb

## 2016-06-28 DIAGNOSIS — M419 Scoliosis, unspecified: Secondary | ICD-10-CM | POA: Diagnosis not present

## 2016-06-28 DIAGNOSIS — M2550 Pain in unspecified joint: Secondary | ICD-10-CM

## 2016-06-28 DIAGNOSIS — M999 Biomechanical lesion, unspecified: Secondary | ICD-10-CM | POA: Diagnosis not present

## 2016-06-28 NOTE — Progress Notes (Signed)
[facility] [address] [place], [place] [address] Phone: [phone] Subjective:    I'm seeing this patient by the request  of:  Lorretta Harp, MD   CC: Polyarthralgia follow-up  ION:[id number]  Karen Foley is a 51 y.o. female coming in with complaint of multiple muscular skeletal complaints. Patient was found to have a positive ANA and low vitamin D. Patient's is going to be seen by rheumatology in the near future. Patient has tried conservative therapy including the once weekly vitamin D, over-the-counter medications, as well as the higher dose of the Cymbalta. Patient states none of this seems to be helpful. Patient did respond somewhat to osteopathic manipulation would like to continue this treatment though. Patient states that this made her back feel pretty good for approximately 2-3 weeks but then the pain is coming back over the course the last 7-10 days.     Past Medical History:  Diagnosis Date  . Anxiety   . Asthma   . Colon polyps   . Depression   . Diabetes mellitus   . Endometriosis    s/p LOA 2003  . GERD (gastroesophageal reflux disease)   . Hx of varicella   . Hyperlipidemia   . Hypertension   . OSA on CPAP   . Scoliosis   . Seizures (HCC)   . Thyroid disease    Past Surgical History:  Procedure Laterality Date  . ABDOMINAL HYSTERECTOMY  2003   TAH BSO endometriosis  . BREAST BIOPSY  2008  . laproscopy  [phone]  . MASTOIDECTOMY Right 2011  . MASTOIDECTOMY  2011  . OVARIAN CYST REMOVAL  2011   Social History   Social History  . Marital status: Single    Spouse name: N/A  . Number of children: N/A  . Years of education: N/A   Social History Main Topics  . Smoking status: Never Smoker  . Smokeless tobacco: Never Used  . Alcohol use No  . Drug use: No  . Sexual activity: No   Other Topics Concern  . 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 Clindamycin/Lincomycin Hives and Itching   Family History  Problem Relation Age of Onset  . Hypertension Mother   . Breast cancer Mother 61    breast  . Diabetes Mother   . Prostate cancer Father     prostate  . Diabetes Father   . Hypertension Father   . Goiter Maternal Grandmother   . Diabetes Maternal Grandmother   . Diabetes Maternal Grandfather   . Diabetes Paternal Grandmother     Past medical history, social, surgical and family history all reviewed in electronic medical record.  No pertanent information unless stated regarding to the chief complaint.   Review of Systems: No headache, visual changes, nausea, vomiting, diarrhea, constipation, dizziness, abdominal pain, skin rash, fevers, chills, night sweats, weight loss, swollen lymph nodes, body aches, joint swelling, muscle aches, chest pain, shortness of breath, mood changes.  \   Objective   Blood pressure (!) 164/104, pulse 74, height 5\' 3"  (1.6 m), weight 172 lb (78 kg).  Systems examined below as of 06/28/16 General: NAD A&O x3 mood, affect normal  HEENT: Pupils equal, extraocular movements intact no nystagmus Respiratory: not short of breath at rest or with speaking Cardiovascular: No lower extremity edema, non tender Skin: Warm dry intact with no signs of infection or rash on extremities or on axial skeleton. Abdomen: Soft nontender, no masses Neuro: Cranial nerves  intact, neurovascularly intact in all extremities with 2+ DTRs and 2+ pulses. Lymph: No lymphadenopathy appreciated today  Gait normal with good balance and coordination.  MSK: tender with full range of motion and good stability and symmetric strength and tone of shoulders, elbows, wrist,  knee hips and ankles bilaterally.   Neck: Inspection  unremarkable. No palpable stepoffs. Negative Spurling's maneuver. Near full range of motion. Patient does seem to be somewhat uncomfortable in the terminal Grip strength and sensation normal in bilateral hands Strength good C4 to T1 distribution No sensory change to C4 to T1 Negative Hoffman sign bilaterally Reflexes normal patient is tender to palpation and appears palmar musculature of the trapezius is bilaterally  Osteopathic findings Cervical C4 flexed rotated and side bent right C5 flexed rotated and side bent left T3 extended rotated and side bent right inhaled third rib T10 extended rotated and side bent left L3 flexed rotated and side bent right Sacrum right on right      Impression and Recommendations:     This case required medical decision making of moderate complexity.      Note: This dictation was prepared with Dragon dictation along with smaller phrase technology. Any transcriptional errors that result from this process are unintentional.

## 2016-06-28 NOTE — Patient Instructions (Signed)
Good to see you  Ice 20 minutes 2 times daily. Usually after activity and before bed. I am interested to see what rheumatology says I think you are doing better  Stay active.  Continue the medicine You are making progress! See me again 4 weeks.

## 2016-06-29 NOTE — Assessment & Plan Note (Signed)
Decision today to treat with OMT was based on Physical Exam  After verbal consent patient was treated with HVLA, ME, FPR techniques in cervical, thoracic, rib, lumbar and sacral areas  Patient tolerated the procedure well with improvement in symptoms  Patient given exercises, stretches and lifestyle modifications  See medications in patient instructions if given  Patient will follow up in 4 weeks 

## 2016-06-29 NOTE — Assessment & Plan Note (Signed)
Patient does have the polyarthralgia. Not responding to the Cymbalta. We will consider possibly changing patient to Effexor in the long run. We will discuss with her at follow-up. Patient has responded somewhat to manipulation and we will continue that this another treatment option. Patient will be seen rheumatology in this could also aid in the possibility of treatment. Patient will come back and see me again in 4 weeks.

## 2016-06-29 NOTE — Assessment & Plan Note (Signed)
Stable. Continuing core strengthening.

## 2016-07-09 DIAGNOSIS — R768 Other specified abnormal immunological findings in serum: Secondary | ICD-10-CM | POA: Diagnosis not present

## 2016-07-09 DIAGNOSIS — M2550 Pain in unspecified joint: Secondary | ICD-10-CM | POA: Diagnosis not present

## 2016-07-26 ENCOUNTER — Ambulatory Visit: Payer: 59 | Admitting: Family Medicine

## 2016-08-08 ENCOUNTER — Other Ambulatory Visit: Payer: 59

## 2016-08-10 ENCOUNTER — Other Ambulatory Visit: Payer: Self-pay | Admitting: Family Medicine

## 2016-08-13 ENCOUNTER — Encounter: Payer: 59 | Admitting: Internal Medicine

## 2016-11-05 ENCOUNTER — Other Ambulatory Visit: Payer: Self-pay | Admitting: Family Medicine

## 2016-12-12 ENCOUNTER — Other Ambulatory Visit: Payer: Self-pay | Admitting: Internal Medicine

## 2016-12-12 NOTE — Telephone Encounter (Signed)
lmom for pt to call back

## 2016-12-12 NOTE — Telephone Encounter (Signed)
Patient is over due for annual. Sending in 30 day supply for blood pressure medication. Please help patient schedule. Thank you

## 2016-12-17 ENCOUNTER — Other Ambulatory Visit: Payer: Self-pay

## 2016-12-17 MED ORDER — LOSARTAN POTASSIUM-HCTZ 100-25 MG PO TABS: 1.0000 | ORAL_TABLET | Freq: Every day | ORAL | 0 refills | Status: DC

## 2016-12-17 NOTE — Telephone Encounter (Signed)
lmom for pt to call back

## 2016-12-20 NOTE — Telephone Encounter (Signed)
lmom for pt to call back

## 2017-02-07 ENCOUNTER — Encounter: Payer: Self-pay | Admitting: Internal Medicine

## 2017-11-18 DIAGNOSIS — E119 Type 2 diabetes mellitus without complications: Secondary | ICD-10-CM | POA: Diagnosis not present

## 2022-10-24 ENCOUNTER — Emergency Department (HOSPITAL_COMMUNITY): Payer: 59

## 2022-10-24 ENCOUNTER — Other Ambulatory Visit: Payer: Self-pay

## 2022-10-24 ENCOUNTER — Observation Stay (HOSPITAL_COMMUNITY): Payer: 59

## 2022-10-24 ENCOUNTER — Encounter (HOSPITAL_COMMUNITY): Payer: Self-pay | Admitting: Internal Medicine

## 2022-10-24 ENCOUNTER — Observation Stay (HOSPITAL_COMMUNITY)
Admission: EM | Admit: 2022-10-24 | Discharge: 2022-10-25 | Disposition: A | Discharge status: Home / Self Care | Payer: 59 | Attending: Internal Medicine | Admitting: Internal Medicine

## 2022-10-24 DIAGNOSIS — E1169 Type 2 diabetes mellitus with other specified complication: Secondary | ICD-10-CM

## 2022-10-24 DIAGNOSIS — F341 Dysthymic disorder: Secondary | ICD-10-CM | POA: Diagnosis not present

## 2022-10-24 DIAGNOSIS — I10 Essential (primary) hypertension: Secondary | ICD-10-CM | POA: Diagnosis not present

## 2022-10-24 DIAGNOSIS — I639 Cerebral infarction, unspecified: Secondary | ICD-10-CM | POA: Diagnosis not present

## 2022-10-24 DIAGNOSIS — J45909 Unspecified asthma, uncomplicated: Secondary | ICD-10-CM | POA: Insufficient documentation

## 2022-10-24 DIAGNOSIS — Z91040 Latex allergy status: Secondary | ICD-10-CM | POA: Diagnosis not present

## 2022-10-24 DIAGNOSIS — Z7984 Long term (current) use of oral hypoglycemic drugs: Secondary | ICD-10-CM | POA: Diagnosis not present

## 2022-10-24 DIAGNOSIS — G4733 Obstructive sleep apnea (adult) (pediatric): Secondary | ICD-10-CM

## 2022-10-24 DIAGNOSIS — E785 Hyperlipidemia, unspecified: Secondary | ICD-10-CM | POA: Diagnosis not present

## 2022-10-24 DIAGNOSIS — E119 Type 2 diabetes mellitus without complications: Secondary | ICD-10-CM

## 2022-10-24 DIAGNOSIS — Z7982 Long term (current) use of aspirin: Secondary | ICD-10-CM | POA: Insufficient documentation

## 2022-10-24 DIAGNOSIS — R531 Weakness: Secondary | ICD-10-CM | POA: Diagnosis present

## 2022-10-24 DIAGNOSIS — Z79899 Other long term (current) drug therapy: Secondary | ICD-10-CM | POA: Insufficient documentation

## 2022-10-24 DIAGNOSIS — F32A Depression, unspecified: Secondary | ICD-10-CM | POA: Diagnosis present

## 2022-10-24 LAB — CBC
HCT: 43 % (ref 36.0–46.0)
Hemoglobin: 14 g/dL (ref 12.0–15.0)
MCH: 30.2 pg (ref 26.0–34.0)
MCHC: 32.6 g/dL (ref 30.0–36.0)
MCV: 92.7 fL (ref 80.0–100.0)
Platelets: 212 10*3/uL (ref 150–400)
RBC: 4.64 MIL/uL (ref 3.87–5.11)
RDW: 12.3 % (ref 11.5–15.5)
WBC: 8.4 10*3/uL (ref 4.0–10.5)
nRBC: 0 % (ref 0.0–0.2)

## 2022-10-24 LAB — DIFFERENTIAL
Abs Immature Granulocytes: 0.03 10*3/uL (ref 0.00–0.07)
Basophils Absolute: 0.1 10*3/uL (ref 0.0–0.1)
Basophils Relative: 1 %
Eosinophils Absolute: 0.1 10*3/uL (ref 0.0–0.5)
Eosinophils Relative: 1 %
Immature Granulocytes: 0 %
Lymphocytes Relative: 24 %
Lymphs Abs: 2 10*3/uL (ref 0.7–4.0)
Monocytes Absolute: 0.6 10*3/uL (ref 0.1–1.0)
Monocytes Relative: 8 %
Neutro Abs: 5.5 10*3/uL (ref 1.7–7.7)
Neutrophils Relative %: 66 %

## 2022-10-24 LAB — COMPREHENSIVE METABOLIC PANEL
ALT: 24 U/L (ref 0–44)
AST: 25 U/L (ref 15–41)
Albumin: 4 g/dL (ref 3.5–5.0)
Alkaline Phosphatase: 92 U/L (ref 38–126)
Anion gap: 9 (ref 5–15)
BUN: 12 mg/dL (ref 6–20)
CO2: 22 mmol/L (ref 22–32)
Calcium: 8.8 mg/dL — ABNORMAL LOW (ref 8.9–10.3)
Chloride: 108 mmol/L (ref 98–111)
Creatinine, Ser: 0.91 mg/dL (ref 0.44–1.00)
GFR, Estimated: >60 mL/min (ref >60)
Glucose, Bld: 140 mg/dL — ABNORMAL HIGH (ref 70–99)
Potassium: 3.9 mmol/L (ref 3.5–5.1)
Sodium: 139 mmol/L (ref 135–145)
Total Bilirubin: 1.1 mg/dL (ref 0.3–1.2)
Total Protein: 6.9 g/dL (ref 6.5–8.1)

## 2022-10-24 LAB — I-STAT CHEM 8, ED
BUN: 12 mg/dL (ref 6–20)
Calcium, Ion: 1.04 mmol/L — ABNORMAL LOW (ref 1.15–1.40)
Chloride: 106 mmol/L (ref 98–111)
Creatinine, Ser: 0.8 mg/dL (ref 0.44–1.00)
Glucose, Bld: 138 mg/dL — ABNORMAL HIGH (ref 70–99)
HCT: 42 % (ref 36.0–46.0)
Hemoglobin: 14.3 g/dL (ref 12.0–15.0)
Potassium: 4 mmol/L (ref 3.5–5.1)
Sodium: 141 mmol/L (ref 135–145)
TCO2: 27 mmol/L (ref 22–32)

## 2022-10-24 LAB — APTT: aPTT: 27 seconds (ref 24–36)

## 2022-10-24 LAB — PROTIME-INR
INR: 1 (ref 0.8–1.2)
Prothrombin Time: 13.8 seconds (ref 11.4–15.2)

## 2022-10-24 LAB — GLUCOSE, CAPILLARY
Glucose-Capillary: 91 mg/dL (ref 70–99)
Glucose-Capillary: 93 mg/dL (ref 70–99)

## 2022-10-24 LAB — CBG MONITORING, ED: Glucose-Capillary: 152 mg/dL — ABNORMAL HIGH (ref 70–99)

## 2022-10-24 LAB — ETHANOL: Alcohol, Ethyl (B): <10 mg/dL (ref <10)

## 2022-10-24 MED ORDER — ENOXAPARIN SODIUM 40 MG/0.4ML IJ SOSY
40.0000 mg | PREFILLED_SYRINGE | INTRAMUSCULAR | Status: DC
  Administered 2022-10-24: 40 mg via SUBCUTANEOUS
  Filled 2022-10-24: qty 0.4

## 2022-10-24 MED ORDER — IOHEXOL 350 MG/ML SOLN
75.0000 mL | Freq: Once | INTRAVENOUS | Status: AC | PRN
  Administered 2022-10-24: 75 mL via INTRAVENOUS

## 2022-10-24 MED ORDER — ACETAMINOPHEN 650 MG RE SUPP: 650.0000 mg | RECTAL | Status: DC | PRN

## 2022-10-24 MED ORDER — SERTRALINE HCL 50 MG PO TABS
50.0000 mg | ORAL_TABLET | Freq: Every day | ORAL | Status: DC
  Administered 2022-10-24: 50 mg via ORAL
  Filled 2022-10-24: qty 1

## 2022-10-24 MED ORDER — ACETAMINOPHEN 160 MG/5ML PO SOLN: 650.0000 mg | ORAL | Status: DC | PRN

## 2022-10-24 MED ORDER — DULOXETINE HCL 30 MG PO CPEP: 30.0000 mg | ORAL_CAPSULE | Freq: Every day | ORAL | Status: DC

## 2022-10-24 MED ORDER — ASPIRIN 81 MG PO CHEW
81.0000 mg | CHEWABLE_TABLET | Freq: Every day | ORAL | Status: DC
  Administered 2022-10-25: 81 mg via ORAL
  Filled 2022-10-24: qty 1

## 2022-10-24 MED ORDER — STROKE: EARLY STAGES OF RECOVERY BOOK
Freq: Once | Status: AC
  Filled 2022-10-24: qty 1

## 2022-10-24 MED ORDER — KETOROLAC TROMETHAMINE 30 MG/ML IJ SOLN
30.0000 mg | Freq: Once | INTRAMUSCULAR | Status: AC
  Administered 2022-10-24: 30 mg via INTRAVENOUS
  Filled 2022-10-24: qty 1

## 2022-10-24 MED ORDER — INSULIN ASPART 100 UNIT/ML IJ SOLN: 0.0000 [IU] | Freq: Three times a day (TID) | INTRAMUSCULAR | Status: DC

## 2022-10-24 MED ORDER — ATORVASTATIN CALCIUM 10 MG PO TABS
20.0000 mg | ORAL_TABLET | Freq: Every day | ORAL | Status: DC
  Administered 2022-10-25: 20 mg via ORAL
  Filled 2022-10-24: qty 2

## 2022-10-24 MED ORDER — CLOPIDOGREL BISULFATE 75 MG PO TABS
75.0000 mg | ORAL_TABLET | Freq: Once | ORAL | Status: AC
  Administered 2022-10-24: 75 mg via ORAL
  Filled 2022-10-24: qty 1

## 2022-10-24 MED ORDER — ROSUVASTATIN CALCIUM 5 MG PO TABS: 10.0000 mg | ORAL_TABLET | Freq: Every day | ORAL | Status: DC

## 2022-10-24 MED ORDER — CLOPIDOGREL BISULFATE 75 MG PO TABS
75.0000 mg | ORAL_TABLET | Freq: Every day | ORAL | Status: DC
  Administered 2022-10-25: 75 mg via ORAL
  Filled 2022-10-24: qty 1

## 2022-10-24 MED ORDER — DIPHENHYDRAMINE HCL 50 MG/ML IJ SOLN
25.0000 mg | Freq: Once | INTRAMUSCULAR | Status: AC
  Administered 2022-10-24: 25 mg via INTRAVENOUS
  Filled 2022-10-24: qty 1

## 2022-10-24 MED ORDER — PROCHLORPERAZINE EDISYLATE 10 MG/2ML IJ SOLN
10.0000 mg | Freq: Once | INTRAMUSCULAR | Status: AC
  Administered 2022-10-24: 10 mg via INTRAVENOUS
  Filled 2022-10-24: qty 2

## 2022-10-24 MED ORDER — ASPIRIN 81 MG PO CHEW
81.0000 mg | CHEWABLE_TABLET | Freq: Once | ORAL | Status: AC
  Administered 2022-10-24: 81 mg via ORAL
  Filled 2022-10-24: qty 1

## 2022-10-24 MED ORDER — SODIUM CHLORIDE 0.9% FLUSH
3.0000 mL | Freq: Once | INTRAVENOUS | Status: AC
  Administered 2022-10-24: 3 mL via INTRAVENOUS

## 2022-10-24 MED ORDER — SENNOSIDES-DOCUSATE SODIUM 8.6-50 MG PO TABS: 1.0000 | ORAL_TABLET | Freq: Every evening | ORAL | Status: DC | PRN

## 2022-10-24 MED ORDER — ACETAMINOPHEN 325 MG PO TABS: 650.0000 mg | ORAL_TABLET | ORAL | Status: DC | PRN

## 2022-10-24 NOTE — ED Notes (Signed)
Patient transported to MRI 

## 2022-10-24 NOTE — ED Provider Notes (Signed)
[place] EMERGENCY DEPARTMENT AT [facility] Provider Note   CSN: [id number] Arrival date & time: 10/24/22  1013  An emergency department physician performed an initial assessment on this suspected stroke patient at 1013.  History  No chief complaint on file.   Karen Foley is a 57 y.o. female.  HPI   Patient is a 57 year old female, she has a history of high cholesterol, diabetes and hypertension, the patient reportedly was in her usual state of health she works at [employer] on [address], at 915 this morning she started to have some right-sided deficits, this waxed and waned in intensity for the paramedics who brought her here right away.  She arrives within 1 hour, I met the patient at the EMS injury with neurology, the patient states that she is having difficulty using both of her arms at this time  She reportedly had a previous evaluation at [facility] although my review of the medical record does not show any recent evaluations in the hospital setting.  She has been to the urgent care and the family doctor for a variety of complaints including hypertension constipation and pansinusitis over the last year.  Home Medications Prior to Admission medications   Medication Sig Start Date End Date Taking? Authorizing Provider  Diclofenac Sodium (PENNSAID) 2 % SOLN Apply 1 pump twice daily as needed. 05/29/16   Judi Saa, DO  DULoxetine (CYMBALTA) 30 MG capsule Take 1 capsule (30 mg total) by mouth daily. Increase to  60 mg per day after 2 weeks Patient taking differently: Take 60 mg by mouth daily.  06/03/16   Panosh, Neta Mends, MD  losartan-hydrochlorothiazide (HYZAAR) 100-25 MG tablet Take 1 tablet by mouth daily. 12/17/16   Panosh, Neta Mends, MD  metFORMIN (GLUCOPHAGE-XR) 500 MG 24 hr tablet TAKE 2 TABLETS BY MOUTH DAILY WITH BREAKFAST. 03/28/16   Panosh, Neta Mends, MD  ONE TOUCH ULTRA TEST test strip TEST DAILY AS DIRECTED 02/07/16   Panosh, Neta Mends, MD   [facility] DELICA LANCETS FINE MISC Test once daily 05/30/14   Panosh, Neta Mends, MD  polyethylene glycol powder (GLYCOLAX/MIRALAX) powder Take 17 g by mouth daily. 01/10/15   Panosh, Neta Mends, MD  rosuvastatin (CRESTOR) 10 MG tablet TAKE 1 TABLET (10 MG TOTAL) BY MOUTH DAILY. 06/24/16   Panosh, Neta Mends, MD  Turmeric 500 MG TABS Take 1 capsule by mouth 2 (two) times daily.    [provider]  Vitamin D, Ergocalciferol, (DRISDOL) 50000 units CAPS capsule TAKE 1 CAPSULE (50,000 UNITS TOTAL) BY MOUTH EVERY 7 (SEVEN) DAYS. 11/05/16   Judi Saa, DO      Allergies    Clindamycin/lincomycin    Review of Systems   Review of Systems  All other systems reviewed and are negative.   Physical Exam Updated Vital Signs BP (!) 139/103 (BP Location: Right Arm)   Pulse 76   Temp 98.3 F (36.8 C) (Oral)   Resp (!) 21   Ht 1.6 m (5\' 3" )   Wt 79.4 kg   SpO2 99%   BMI 31.00 kg/m  Physical Exam Vitals and nursing note reviewed.  Constitutional:      General: She is not in acute distress.    Appearance: She is well-developed.  HENT:     Head: Normocephalic and atraumatic.     Mouth/Throat:     Pharynx: No oropharyngeal exudate.  Eyes:     General: No scleral icterus.  Right eye: No discharge.        Left eye: No discharge.     Conjunctiva/sclera: Conjunctivae normal.     Pupils: Pupils are equal, round, and reactive to light.  Neck:     Thyroid: No thyromegaly.     Vascular: No JVD.  Cardiovascular:     Rate and Rhythm: Normal rate and regular rhythm.     Heart sounds: Normal heart sounds. No murmur heard.    No friction rub. No gallop.  Pulmonary:     Effort: Pulmonary effort is normal. No respiratory distress.     Breath sounds: Normal breath sounds. No wheezing or rales.  Abdominal:     General: Bowel sounds are normal. There is no distension.     Palpations: Abdomen is soft. There is no mass.     Tenderness: There is no abdominal tenderness.  Musculoskeletal:         General: No tenderness. Normal range of motion.     Cervical back: Normal range of motion and neck supple.     Right lower leg: No edema.     Left lower leg: No edema.  Lymphadenopathy:     Cervical: No cervical adenopathy.  Skin:    General: Skin is warm and dry.     Findings: No erythema or rash.  Neurological:     Mental Status: She is alert.     Coordination: Coordination normal.     Comments: The patient is able to talk, she is able to move both legs and left 5 out of 5 strength, she is able to use both hands with normal strength, when she has finger-nose-finger she has some difficulty with both hands and feels like she cannot do it although she does it intermittently.  She does not have any obvious facial droop but when she smiles she initially shows her teeth but then it falls on the right side.  When she starts to talk her speech is normal  Psychiatric:        Behavior: Behavior normal.     ED Results / Procedures / Treatments   Labs (all labs ordered are listed, but only abnormal results are displayed) Labs Reviewed  COMPREHENSIVE METABOLIC PANEL - Abnormal; Notable for the following components:      Result Value   Glucose, Bld 140 (*)    Calcium 8.8 (*)    All other components within normal limits  I-STAT CHEM 8, ED - Abnormal; Notable for the following components:   Glucose, Bld 138 (*)    Calcium, Ion 1.04 (*)    All other components within normal limits  CBG MONITORING, ED - Abnormal; Notable for the following components:   Glucose-Capillary 152 (*)    All other components within normal limits  PROTIME-INR  APTT  CBC  DIFFERENTIAL  ETHANOL    EKG None  Radiology MR BRAIN WO CONTRAST  Result Date: 10/24/2022 CLINICAL DATA:  Neuro deficit with acute stroke suspected. Right-sided weakness noticed by coworkers. EXAM: MRI HEAD WITHOUT CONTRAST TECHNIQUE: Multiplanar, multiecho pulse sequences of the brain and surrounding structures were obtained without  intravenous contrast. COMPARISON:  Brain MRI 10/25/2009 FINDINGS: Brain: Small focus of restricted diffusion in the low left internal capsule. Small FLAIR hyperintensities in the cerebral white matter and right cerebellum attributed to chronic small vessel ischemia given the patient's history. Brain volume is normal. No hydrocephalus or masslike finding. Vascular: Normal flow voids. Prominent arachnoid granulation along the right transverse sigmoid dural sinus junction, stable  from prior brain MRI. Skull and upper cervical spine: No focal marrow lesion. 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 Unremarkable cavernous sinus region. Chronic soft tissue nodule at the superior and medial right orbit. IMPRESSION: 1. 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 2. Chronic white matter disease that has progressed from 2011. Electronically Signed   By: Tiburcio Pea M.D.   On: 10/24/2022 11:53   CT HEAD CODE STROKE WO CONTRAST  Result Date: 10/24/2022 CLINICAL DATA:  Code stroke.  Right-sided deficits. EXAM: CT HEAD WITHOUT CONTRAST TECHNIQUE: Contiguous axial images were obtained from the base of the skull through the vertex without intravenous contrast. RADIATION DOSE REDUCTION: This exam was performed according to the departmental dose-optimization program which includes automated exposure control, adjustment of the mA and/or kV according to patient size and/or use of iterative reconstruction technique. COMPARISON:  Brain MRI 10/08/2022 FINDINGS: Brain: There is no acute intracranial hemorrhage, extra-axial fluid collection, or acute infarct. Parenchymal volume is normal. The ventricles are normal in size. Gray-white differentiation is preserved. Scattered small foci of hypodensity in the supratentorial white matter are nonspecific but likely reflect sequela of chronic small-vessel ischemic change The pituitary and suprasellar region are normal. There is no mass lesion.  There is no mass effect or midline shift. Vascular: No hyperdense vessel or unexpected calcification. Skull: Normal. Negative for fracture or focal lesion. Sinuses/Orbits: The paranasal sinuses are clear. The globes and orbits are unremarkable. Other: The patient is status post right canal wall up mastoidectomy. The mastoid bowl is clear. ASPECTS [facility] Stroke Program Early CT Score) - Ganglionic level infarction (caudate, lentiform nuclei, internal capsule, insula, M1-M3 cortex): 7 - Supraganglionic infarction (M4-M6 cortex): 3 Total score (0-10 with 10 being normal): 10 IMPRESSION: No acute intracranial pathology. Findings communicated with Dr. Derry Lory at 10:45 am. Electronically Signed   By: Lesia Hausen M.D.   On: 10/24/2022 10:47    Procedures Procedures    Medications Ordered in ED Medications  aspirin chewable tablet 81 mg (has no administration in time range)  clopidogrel (PLAVIX) tablet 75 mg (has no administration in time range)  sodium chloride flush (NS) 0.9 % injection 3 mL (3 mLs Intravenous Given 10/24/22 1010)  prochlorperazine (COMPAZINE) injection 10 mg (10 mg Intravenous Given 10/24/22 1041)  diphenhydrAMINE (BENADRYL) injection 25 mg (25 mg Intravenous Given 10/24/22 1041)  ketorolac (TORADOL) 30 MG/ML injection 30 mg (30 mg Intravenous Given 10/24/22 1040)    ED Course/ Medical Decision Making/ A&P                             Medical Decision Making Amount and/or Complexity of Data Reviewed Labs: ordered. Radiology: ordered.  Risk Prescription drug management.    This patient presents to the ED for concern of acute neurologic abnormality with right-sided weakness, this involves an extensive number of treatment options, and is a complaint that carries with it a high risk of complications and morbidity.  The differential diagnosis includes stroke, TIA, anxiety reaction, the patient is very tearful   Co morbidities that complicate the patient evaluation  Hypertension  diabetes and cholesterol   Additional history obtained:  Additional history obtained from medical record and External records from outside source obtained and reviewed including office visits The patient reports to me that she did have seizures up into the age of 67 but they stopped spontaneously and she no longer takes medication for them The patient  had been admitted to the [facility] and discharged on Oct 08, 2022, found to have a low magnesium but had a negative stroke workup Reports this morning that while she was in her usual state of health and had a normal morning drove her self to work, she became acutely symptomatic with right-sided arm heaviness, then noticed her right face was drooping then had trouble walking which prompted her to call for help   Lab Tests:  I Ordered, and personally interpreted labs.  The pertinent results include: Metabolic panel with mild hyperglycemia, INR of 1.0, CBC without anemia or leukocytosis, normal renal function   Imaging Studies ordered:  I ordered imaging studies including CT scan of the brain without contrast showing no hemorrhage, MRI of the brain I independently visualized and interpreted imaging which showed acute ischemic stroke, small acute infarct in the lower left internal capsule I agree with the radiologist interpretation   Cardiac Monitoring: / EKG:  The patient was maintained on a cardiac monitor.  I personally viewed and interpreted the cardiac monitored which showed an underlying rhythm of: Normal sinus rhythm   Consultations Obtained:  I requested consultation with the neurologist who recommended admission, then discussed with inpatient medical team,  and discussed lab and imaging findings as well as pertinent plan - they recommend: Admission to the hospital, neurology recommends dual antiplatelet therapy   Problem List / ED Course / Critical interventions / Medication  management  Patient with history of what appears to be a new acute onset stroke, treated with dual antiplatelet, she is not having any symptoms at this time on reevaluation at 12:30 PM Rediscussed case with neurology who now recommends admission for stroke workup I ordered medication including aspirin and Plavix for antiplatelet benefit Reevaluation of the patient after these medicines showed that the patient stabilized I have reviewed the patients home medicines and have made adjustments as needed   Social Determinants of Health:  None   Test / Admission - Considered:  The patient does not have a doctor, she will be admitted for unassigned medicine         Final Clinical Impression(s) / ED Diagnoses Final diagnoses:  Acute ischemic stroke [facility])  Essential hypertension    Rx / DC Orders ED Discharge Orders     None         Eber Hong, MD 10/24/22 1234

## 2022-10-24 NOTE — ED Notes (Signed)
Help get patient on the monitor into a gown got patient a warm blanket patient is resting with call bell in reach

## 2022-10-24 NOTE — Evaluation (Signed)
Physical Therapy Evaluation & Discharge Patient Details Name: Karen Foley MRN: [id number] DOB: 1966-05-03 Today's Date: 10/24/2022  History of Present Illness  Pt is a 57 y.o. female who presented 10/24/22 with R-sided weakness. MRI showed a small acute infarct in the low left internal capsule. PMH significant for anxiety/depression, DM, HTN, HLD, OSA on CPAP, Seizures as a child, asthma, scoliosis   Clinical Impression  Pt presents with condition above. PTA, she was independent without DME, driving, working, and living alone in an apartment above a house with x15 STE. Currently, pt appears and reports to be back to her baseline, demonstrating WFL, intact, and symmetrical strength, sensation, and coordination in all x4 extremities. She scored 24/24 on the DGI. A score of > 22/24 is indicative of a safe ambulator. Pt was independent with all functional mobility this date. All education completed and questions answered. PT will sign off.     Recommendations for follow up therapy are one component of a multi-disciplinary discharge planning process, led by the attending physician.  Recommendations may be updated based on patient status, additional functional criteria and insurance authorization.  Follow Up Recommendations       Assistance Recommended at Discharge None  Patient can return home with the following       Equipment Recommendations None recommended by PT  Recommendations for Other Services       Functional Status Assessment Patient has not had a recent decline in their functional status     Precautions / Restrictions Precautions Precautions: None Restrictions Weight Bearing Restrictions: No      Mobility  Bed Mobility Overal bed mobility: Modified Independent             General bed mobility comments: No assistance needed, HOB elevated    Transfers Overall transfer level: Independent Equipment used: None               General transfer comment: No  assistance needed, no LOB    Ambulation/Gait Ambulation/Gait assistance: Independent Gait Distance (Feet): 210 Feet (x2 bouts of ~210 ft > ~70 ft) Assistive device: None Gait Pattern/deviations: Decreased stride length, Step-through pattern Gait velocity: WFL Gait velocity interpretation: >4.37 ft/sec, indicative of normal walking speed   General Gait Details: Pt with slightly decreased stride length, but potentially baseline. No overt LOB with any dynamic gait challenge and pt reporting feeling at baseline.  Stairs            Wheelchair Mobility    Modified Rankin (Stroke Patients Only) Modified Rankin (Stroke Patients Only) Pre-Morbid Rankin Score: No symptoms Modified Rankin: No symptoms     Balance Overall balance assessment: No apparent balance deficits (not formally assessed)                               Standardized Balance Assessment Standardized Balance Assessment : Dynamic Gait Index   Dynamic Gait Index Level Surface: Normal Change in Gait Speed: Normal Gait with Horizontal Head Turns: Normal Gait with Vertical Head Turns: Normal Gait and Pivot Turn: Normal Step Over Obstacle: Normal Step Around Obstacles: Normal Steps: Normal (assumed, not tested) Total Score: 24       Pertinent Vitals/Pain Pain Assessment Pain Assessment: No/denies pain    Home Living Family/patient expects to be discharged to:: Private residence Living Arrangements: Alone Available Help at Discharge: Friend(s);Available PRN/intermittently (every few days) Type of Home:  (apartment on top of a house) Home Access: Stairs to enter Entrance  Stairs-Rails: Can reach both Entrance Stairs-Number of Steps: 15   Home Layout: One level Home Equipment: None      Prior Function Prior Level of Function : Driving;Working/employed;Independent/Modified Independent             Mobility Comments: No 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     Hand  Dominance   Dominant Hand: Right    Extremity/Trunk Assessment   Upper Extremity Assessment Upper Extremity Assessment: Defer to OT evaluation;Overall WFL for tasks assessed (MMT scores of 5 grossly bil; denied numbness/tingling bil; coordination intact bil)    Lower Extremity Assessment Lower Extremity Assessment: Overall WFL for tasks assessed (MMT scores of 5 grossly bil; sensation and dynamic proprioception intact bil; coordination intact bil)    Cervical / Trunk Assessment Cervical / Trunk Assessment: Normal  Communication   Communication: No difficulties  Cognition Arousal/Alertness: Awake/alert Behavior During Therapy: WFL for tasks assessed/performed Overall Cognitive Status: Within Functional Limits for tasks assessed                                          General Comments General comments (skin integrity, edema, etc.): educated pt on "BE FAST"; pt denied vision changes    Exercises     Assessment/Plan    PT Assessment Patient does not need any further PT services  PT Problem List         PT Treatment Interventions      PT Goals (Current goals can be found in the Care Plan section)  Acute Rehab PT Goals Patient Stated Goal: to never have another CVA PT Goal Formulation: All assessment and education complete, DC therapy Time For Goal Achievement: 10/25/22 Potential to Achieve Goals: Good    Frequency       Co-evaluation               AM-PAC PT "6 Clicks" Mobility  Outcome Measure Help needed turning from your back to your side while in a flat bed without using bedrails?: None Help needed moving from lying on your back to sitting on the side of a flat bed without using bedrails?: None Help needed moving to and from a bed to a chair (including a wheelchair)?: None Help needed standing up from a chair using your arms (e.g., wheelchair or bedside chair)?: None Help needed to walk in hospital room?: None Help needed climbing 3-5  steps with a railing? : None 6 Click Score: 24    End of Session   Activity Tolerance: Patient tolerated treatment well Patient left: in bed;with call bell/phone within reach   PT Visit Diagnosis: Other symptoms and signs involving the nervous system (Z61.096)    Time: 0454-0981 PT Time Calculation (min) (ACUTE ONLY): 17 min   Charges:   PT Evaluation $PT Eval Low Complexity: 1 Low          Raymond Gurney, PT, DPT Acute Rehabilitation Services  Office: [phone]   Jewel Baize 10/24/2022, 4:51 PM

## 2022-10-24 NOTE — Consult Note (Signed)
NEUROLOGY CONSULTATION NOTE   Date of service: October 24, 2022 Patient Name: Karen Foley MRN:  [id number] DOB:  12/17/1965 Reason for consult: "CODE STROKE" Requesting Provider: Eber Hong, MD   History of Present Illness  Karen Foley is a 57 y.o. female with PMH significant for anxiety/depression, DM, HTN, HLD, OSA on CPAP, Seizures as a child who came in as a CODE STROKE due to right-sided weakness.  Per EMS, these symptoms waxed and waned while en route. SBP was in the 130s with them. CBG 152. Labs unremarkable.  On exam in ED, patient is alert and oriented, right-sided weakness is slight, facial droop is inconsistent. Taken emergently to CT, which was negative. After CT, patient was able to sign the EMS sheet with her right hand, while held up.  Patient had a similar episode 5/20, workup at [facility] was negative, concern for medication non-compliance.   LKW: 0915 mRS: 0 tNKASE: No, inconsistent, mild symptoms Thrombectomy: No, inconsistent, mild symptoms  NIHSS components Score: Comment  1a Level of Conscious 0[x]  1[]  2[]  3[]      1b LOC Questions 0[x]  1[]  2[]       1c LOC Commands 0[x]  1[]  2[]       2 Best Gaze 0[x]  1[]  2[]       3 Visual 0[x]  1[]  2[]  3[]      4 Facial Palsy 0[x]  1[]  2[]  3[]      5a Motor Arm - left 0[x]  1[]  2[]  3[]  4[]  UN[]    5b Motor Arm - Right 0[x]  1[]  2[]  3[]  4[]  UN[]   Inconsistent findings as patient would present as weak, then be able to move RUE with no problems or weakness noted.   6a Motor Leg - Left 0[x]  1[]  2[]  3[]  4[]  UN[]    6b Motor Leg - Right 0[x]  1[]  2[]  3[]  4[]  UN[]    7 Limb Ataxia 0[x]  1[]  2[]  3[]  UN[]     8 Sensory 0[]  1[x]  2[]  UN[]      9 Best Language 0[x]  1[]  2[]  3[]      10 Dysarthria 0[x]  1[]  2[]  UN[]      11 Extinct. and Inattention 0[x]  1[]  2[]        TOTAL:           1          ROS   Constitutional Denies weight loss, fever and chills.   HEENT Denies changes in vision and hearing.   Respiratory Denies SOB and cough.   CV  Denies palpitations and CP   GI Denies abdominal pain, nausea, vomiting and diarrhea.   GU Denies dysuria and urinary frequency.   MSK Denies myalgia and joint pain.   Skin Denies rash and pruritus.   Neurological Denies headache and syncope.   Psychiatric Denies recent changes in mood. Denies anxiety and depression.    Past History   Past Medical History:  Diagnosis Date   Anxiety    Asthma    Colon polyps    Depression    Diabetes mellitus    Endometriosis    s/p LOA 2003   GERD (gastroesophageal reflux disease)    Hx of varicella    Hyperlipidemia    Hypertension    OSA on CPAP    Scoliosis    Seizures (HCC)    Thyroid disease    Past Surgical History:  Procedure Laterality Date   ABDOMINAL HYSTERECTOMY  2003   TAH BSO endometriosis   BREAST BIOPSY  2008   laproscopy  [phone]   MASTOIDECTOMY Right 2011   MASTOIDECTOMY  2011   OVARIAN CYST REMOVAL  2011   Family History  Problem Relation Age of Onset   Hypertension Mother    Breast cancer Mother 48       breast   Diabetes Mother    Prostate cancer Father        prostate   Diabetes Father    Hypertension Father    Goiter Maternal Grandmother    Diabetes Maternal Grandmother    Diabetes Maternal Grandfather    Diabetes Paternal Grandmother    Social History   Socioeconomic History   Marital status: Single    Spouse name: Not on file   Number of children: Not on file   Years of education: Not on file   Highest education level: Not on file  Occupational History   Not on file  Tobacco Use   Smoking status: Never   Smokeless tobacco: Never  Substance and Sexual Activity   Alcohol use: No   Drug use: No   Sexual activity: Never    Birth control/protection: Abstinence  Other Topics Concern   Not on file  Social History Narrative   Single non smoker    HH of 2  Best friend" like a sister"   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  Physical Exam   General: Laying comfortably in bed; in no acute distress.  HENT: Normal oropharynx and mucosa. Normal external appearance of ears and nose.  Neck: Supple, no pain or tenderness  CV: No JVD. No peripheral edema.  Pulmonary: Symmetric Chest rise. Normal respiratory effort.  Abdomen: Soft to touch, non-tender.  Ext: No cyanosis, edema, or deformity  Skin: No rash. Normal palpation of skin.   Musculoskeletal: Normal digits and nails by inspection. No clubbing.   Neurologic Examination  Mental status/Cognition: Alert, oriented to self, place, month and year, good attention.  Speech/language: Fluent, comprehension intact, object naming intact, repetition intact.  Cranial nerves:   CN II Pupils equal and reactive to light, no VF deficits    CN III,IV,VI EOM intact, no gaze preference or deviation, no nystagmus    CN V normal sensation in V1, V2, and V3 segments bilaterally    CN VII no asymmetry, no nasolabial fold flattening    CN VIII normal hearing to speech    CN IX & X normal palatal elevation, no uvular deviation    CN XI 5/5 head turn and 5/5 shoulder shrug bilaterally    CN XII midline tongue protrusion    Motor:  Able to move all extremities spontaneously and on commands.  RUE with some inconsistent findings, as strength ranged from a 4- to 5/5, strong grip with encouragement, no drift.  5/5 in remaining extremities with no drifts present.  Normal bulk and tone, no  tremors.  Sensation:  Intact and symmetric to light tough throughout  Coordination/Complex Motor:  - Finger to Nose: ? Ataxia at first, but improved throughout exam - Rapid alternating movement: slow, but intact - Gait: deferred for patient safety  Labs   CBC:  Recent Labs  Lab 10/24/22 1024  HGB 14.3  HCT 42.0    Basic Metabolic Panel:  Lab Results  Component Value Date   NA 141 10/24/2022   K 4.0 10/24/2022   CO2 29 12/25/2015   GLUCOSE 138 (H) 10/24/2022   BUN 12 10/24/2022   CREATININE 0.80 10/24/2022   CALCIUM 9.2 12/25/2015   GFRNONAA >60 12/01/2014   GFRAA >60 12/01/2014   Lipid Panel:  Lab Results  Component Value Date   LDLCALC 122 (H) 08/29/2014   HgbA1c:  Lab Results  Component Value Date   HGBA1C 7.8 04/22/2016   Urine Drug Screen: No results found for: "LABOPIA", "COCAINSCRNUR", "LABBENZ", "AMPHETMU", "THCU", "LABBARB"  Alcohol Level No results found for: "ETH"  CT Head without contrast(Personally reviewed): No acute intracranial pathology.     Impression   REBACCA RUMERY is a 57 y.o. female with PMH significant for anxiety/depression, DM, HTN, HLD, OSA on CPAP, Seizures as a child who came in as a CODE STROKE due to right-sided weakness.  Per EMS, these symptoms waxed and waned while en route. SBP was in the 130s with them. On exam in ED, patient is alert and oriented, right-sided weakness is slight, facial droop is inconsistent. Taken emergently to CT, which was negative. After CT, patient was able to sign the EMS sheet with her right hand, while held up.  Acute Stroke versus FND/Conversion Disorder   Recommendations   - Routine MRI  If positive, admit for stroke work-up - Q2 Neuro checks - STAT CT for acute neuro change ______________________________________________________________________   Thank you for the opportunity to take part in the care of this patient. If you have any further questions, please contact the neurology  consultation attending.   Pt seen by Neuro NP/APP and later by MD. Note/plan to be edited by MD as needed.    Lynnae January, DNP, AGACNP-BC [facility] Please use AMION for contact information & EPIC for messaging.   NEUROHOSPITALIST ADDENDUM Performed a face to face diagnostic evaluation.   I have reviewed the contents of history and physical exam as documented by PA/ARNP/Resident and agree with above documentation.  I have discussed and formulated the above plan as documented. Edits to the note have been made as needed.  Impression/Key exam findings/Plan: Does have stroke risk factors and brought in for R sided symptoms. However, huge functional overlay. Symptoms pretty much completely go away when she is distracted. I personally witnessed her signing her name on the EMS tablet. Symmetric facial smile when distracted but when asked to smile, would have facial asymmetry. With encouragement, her strength in 5/5 in RUE and RLE.  Symptoms felt non disabling and thus not offered tnkase. Presentation not consistent with LVO.  Will get MRI Brain to clarify and if it shows stroke, admit for stroke workup.  Erick Blinks, MD [facility] [phone]   If 7pm to 7am, please call on call as listed on AMION.

## 2022-10-24 NOTE — Code Documentation (Signed)
Stroke Response Nurse Documentation Code Documentation  Karen Foley is a 57 y.o. female arriving to [facility]  via [facility] on 6/6 with past medical hx of hypertension and diabetes. On No antithrombotic. Code stroke was activated by EMS.   Patient from work where she was LKW at 0915, just after which time she noticed R sided weakness and sensory deficit.   Stroke team at the bedside on patient arrival. Labs drawn and patient cleared for CT by Dr. Hyacinth Meeker. Patient to CT with team. NIHSS 1, see documentation for details and code stroke times. Patient with right decreased sensation on exam. Patient also complains of R sided weakness but was noted to sign EMS tablet with steady right hand and no arm drift while in CT and hold her phone without difficulty. The following imaging was completed:  CT Head and MRI. Patient is not a candidate for IV Thrombolytic due to mild symptoms and provider discretion. Patient is not not a candidate for IR due to no LVO suspected.   Care Plan: q2 hr NIHSS, MRI.   Bedside handoff with ED RN [place].    Pearlie Oyster  Stroke Response RN

## 2022-10-24 NOTE — ED Triage Notes (Signed)
Pt BIB [facility] from work due to right sided weakness noticed by co-workers.  LKW 0981 today 10/24/22.  Pt has similar experience ago 10/07/22 and was taken to [facility]. VSS. 18g left AC

## 2022-10-24 NOTE — ED Notes (Signed)
Pt was able to ambulate to restroom without assistance. 

## 2022-10-24 NOTE — H&P (Addendum)
History and Physical   Karen Foley:[id number] DOB: [birth year] DOA: 10/24/2022  PCP: Pcp, No   Patient coming from: Home  Chief Complaint: Right-sided weakness  HPI: Karen Foley is a 57 y.o. female with medical history significant of OSA, hypertension, hyperlipidemia, diabetes, scoliosis, depression, anxiety, GERD, history of seizure presenting with right-sided weakness.  Patient reports the this morning around 915 she noticed some right-sided weakness/heaviness and some degree of right-sided facial droop as well.  She then noticed that she was having some difficulty walking and called EMS.  And route EMS noted that her degree of weakness seem to wax and wane.  She denies fevers, chills, chest pain, shortness of breath, abdominal pain, constipation, diarrhea, nausea, vomiting.  She was recently evaluated last month at outside hospital due to unilateral muscle cramping and had a workup that was unremarkable.  She was restarted on medication for her blood pressure and diabetes at that time.  ED Course: Vital signs in the ED notable for blood pressure in the 90s to 130s systolic, heart rate in the 60s to 110s.  Lab workup included CMP with glucose 140.  CBC within normal limits.  PT, PTT, INR within normal limits.  Ethanol level negative.  CT head without acute abnormality.  MRI brain did show small acute infarct of the left internal capsule.  Patient received aspirin, Plavix, Toradol, Compazine, Benadryl in the ED.  Neurology was consulted and are following and with positive MRI recommending stroke workup.  Review of Systems: As per HPI otherwise all other systems reviewed and are negative.  Past Medical History:  Diagnosis Date   Anxiety    Asthma    Colon polyps    Depression    Diabetes mellitus    Endometriosis    s/p LOA 2003   GERD (gastroesophageal reflux disease)    Hx of varicella    Hyperlipidemia    Hypertension    OSA on CPAP    Scoliosis    Seizures (HCC)     Thyroid disease     Past Surgical History:  Procedure Laterality Date   ABDOMINAL HYSTERECTOMY  2003   TAH BSO endometriosis   BREAST BIOPSY  2008   laproscopy  [phone]   MASTOIDECTOMY Right 2011   MASTOIDECTOMY  2011   OVARIAN CYST REMOVAL  2011    Social History  reports that she has never smoked. She has never used smokeless tobacco. She reports that she does not drink alcohol and does not use drugs.  Allergies  Allergen Reactions   Latex Swelling and Anxiety   Clindamycin/Lincomycin Hives and Itching   Metformin Other (See Comments)    GI upset     Family History  Problem Relation Age of Onset   Hypertension Mother    Breast cancer Mother 66       breast   Diabetes Mother    Prostate cancer Father        prostate   Diabetes Father    Hypertension Father    Goiter Maternal Grandmother    Diabetes Maternal Grandmother    Diabetes Maternal Grandfather    Diabetes Paternal Grandmother   Reviewed on admission  Prior to Admission medications   Medication Sig Start Date End Date Taking? Authorizing Provider  amLODipine (NORVASC) 10 MG tablet Take 1 tablet by mouth daily. 11/02/21 12/07/22 Yes [provider]  atorvastatin (LIPITOR) 20 MG tablet Take 1 tablet by mouth daily. 07/26/21 12/07/22 Yes [provider]  empagliflozin (JARDIANCE) 10  MG TABS tablet Take 1 tablet by mouth daily. 10/08/22 12/07/22 Yes [provider]  hydrOXYzine (VISTARIL) 25 MG capsule Take 50 mg by mouth every 8 (eight) hours as needed. 10/08/22  Yes [provider]  sertraline (ZOLOFT) 50 MG tablet Take by mouth. 10/08/22 11/29/22 Yes [provider]  Diclofenac Sodium (PENNSAID) 2 % SOLN Apply 1 pump twice daily as needed. 05/29/16   Judi Saa, DO  DULoxetine (CYMBALTA) 30 MG capsule Take 1 capsule (30 mg total) by mouth daily. Increase to  60 mg per day after 2 weeks Patient taking differently: Take 60 mg by mouth daily.  06/03/16   Panosh,  Neta Mends, MD  losartan-hydrochlorothiazide (HYZAAR) 100-25 MG tablet Take 1 tablet by mouth daily. 12/17/16   Panosh, Neta Mends, MD  magnesium oxide (MAG-OX) 400 MG tablet Take 1 tablet by mouth daily.    [provider]  metFORMIN (GLUCOPHAGE-XR) 500 MG 24 hr tablet TAKE 2 TABLETS BY MOUTH DAILY WITH BREAKFAST. 03/28/16   Panosh, Neta Mends, MD  ONE TOUCH ULTRA TEST test strip TEST DAILY AS DIRECTED 02/07/16   Panosh, Neta Mends, MD  [facility] DELICA LANCETS FINE MISC Test once daily 05/30/14   Panosh, Neta Mends, MD  polyethylene glycol powder (GLYCOLAX/MIRALAX) powder Take 17 g by mouth daily. 01/10/15   Panosh, Neta Mends, MD  rosuvastatin (CRESTOR) 10 MG tablet TAKE 1 TABLET (10 MG TOTAL) BY MOUTH DAILY. 06/24/16   Panosh, Neta Mends, MD  Turmeric 500 MG TABS Take 1 capsule by mouth 2 (two) times daily.    [provider]  Vitamin D, Ergocalciferol, (DRISDOL) 50000 units CAPS capsule TAKE 1 CAPSULE (50,000 UNITS TOTAL) BY MOUTH EVERY 7 (SEVEN) DAYS. 11/05/16   Judi Saa, DO    Physical Exam: Vitals:   10/24/22 1000 10/24/22 1033  BP:  (!) 139/103  Pulse:  76  Resp:  (!) 21  Temp:  98.3 F (36.8 C)  TempSrc:  Oral  SpO2:  99%  Weight: 86 kg 79.4 kg  Height:  5\' 3"  (1.6 m)    Physical Exam Constitutional:      General: She is not in acute distress.    Appearance: Normal appearance.  HENT:     Head: Normocephalic and atraumatic.     Mouth/Throat:     Mouth: Mucous membranes are moist.     Pharynx: Oropharynx is clear.  Eyes:     Extraocular Movements: Extraocular movements intact.     Pupils: Pupils are equal, round, and reactive to light.  Cardiovascular:     Rate and Rhythm: Normal rate and regular rhythm.     Pulses: Normal pulses.     Heart sounds: Normal heart sounds.  Pulmonary:     Effort: Pulmonary effort is normal. No respiratory distress.     Breath sounds: Normal breath sounds.  Abdominal:     General: Bowel sounds are normal. There is no distension.      Palpations: Abdomen is soft.     Tenderness: There is no abdominal tenderness.  Musculoskeletal:        General: No swelling or deformity.  Skin:    General: Skin is warm and dry.  Neurological:     Comments: Mental Status: Patient is awake, alert, oriented x3 No signs of aphasia or neglect Cranial Nerves: II: Pupils equal, round, and reactive to light.   III,IV, VI: EOMI without ptosis or diploplia.  V: Facial sensation is symmetric to light touch. VII: Facial movement is  symmetric.  VIII: hearing is intact to voice X: Uvula elevates symmetrically XI: Shoulder shrug is symmetric. XII: tongue is midline without atrophy or fasciculations.  Motor: Good effort thorughout, at Least 5/5 bilateral UE, 5/5 bilateral lower extremitiy  Sensory: Sensation is grossly intact bilateral UEs & LEs Cerebellar: Finger-Nose intact bilalat    Labs on Admission: I have personally reviewed following labs and imaging studies  CBC: Recent Labs  Lab 10/24/22 1017 10/24/22 1024  WBC 8.4  --   NEUTROABS 5.5  --   HGB 14.0 14.3  HCT 43.0 42.0  MCV 92.7  --   PLT 212  --     Basic Metabolic Panel: Recent Labs  Lab 10/24/22 1017 10/24/22 1024  NA 139 141  K 3.9 4.0  CL 108 106  CO2 22  --   GLUCOSE 140* 138*  BUN 12 12  CREATININE 0.91 0.80  CALCIUM 8.8*  --     GFR: Estimated Creatinine Clearance: 77.4 mL/min (by C-G formula based on SCr of 0.8 mg/dL).  Liver Function Tests: Recent Labs  Lab 10/24/22 1017  AST 25  ALT 24  ALKPHOS 92  BILITOT 1.1  PROT 6.9  ALBUMIN 4.0    Urine analysis:    Component Value Date/Time   COLORURINE YELLOW 11/11/2012 1619   APPEARANCEUR CLEAR 11/11/2012 1619   LABSPEC 1.028 11/11/2012 1619   PHURINE 6.0 11/11/2012 1619   GLUCOSEU NEG 11/11/2012 1619   HGBUR TRACE (A) 11/11/2012 1619   BILIRUBINUR NEG 11/11/2012 1619   BILIRUBINUR neg 10/07/2012 1355   KETONESUR NEG 11/11/2012 1619   PROTEINUR NEG 11/11/2012 1619   UROBILINOGEN 0.2  11/11/2012 1619   NITRITE NEG 11/11/2012 1619   LEUKOCYTESUR NEG 11/11/2012 1619    Radiological Exams on Admission: MR BRAIN WO CONTRAST  Result Date: 10/24/2022 CLINICAL DATA:  Neuro deficit with acute stroke suspected. Right-sided weakness noticed by coworkers. EXAM: MRI HEAD WITHOUT CONTRAST TECHNIQUE: Multiplanar, multiecho pulse sequences of the brain and surrounding structures were obtained without intravenous contrast. COMPARISON:  Brain MRI 10/25/2009 FINDINGS: Brain: Small focus of restricted diffusion in the low left internal capsule. Small FLAIR hyperintensities in the cerebral white matter and right cerebellum attributed to chronic small vessel ischemia given the patient's history. Brain volume is normal. No hydrocephalus or masslike finding. Vascular: Normal flow voids. Prominent arachnoid granulation along the right transverse sigmoid dural sinus junction, stable from prior brain MRI. Skull and upper cervical spine: No focal marrow lesion. 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 Unremarkable cavernous sinus region. Chronic soft tissue nodule at the superior and medial right orbit. IMPRESSION: 1. 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 2. Chronic white matter disease that has progressed from 2011. Electronically Signed   By: Tiburcio Pea M.D.   On: 10/24/2022 11:53   CT HEAD CODE STROKE WO CONTRAST  Result Date: 10/24/2022 CLINICAL DATA:  Code stroke.  Right-sided deficits. EXAM: CT HEAD WITHOUT CONTRAST TECHNIQUE: Contiguous axial images were obtained from the base of the skull through the vertex without intravenous contrast. RADIATION DOSE REDUCTION: This exam was performed according to the departmental dose-optimization program which includes automated exposure control, adjustment of the mA and/or kV according to patient size and/or use of iterative reconstruction technique. COMPARISON:  Brain MRI 10/08/2022 FINDINGS: Brain: There is  no acute intracranial hemorrhage, extra-axial fluid collection, or acute infarct. Parenchymal volume is normal. The ventricles are normal in size. Gray-white differentiation is preserved. Scattered small foci of  hypodensity in the supratentorial white matter are nonspecific but likely reflect sequela of chronic small-vessel ischemic change The pituitary and suprasellar region are normal. There is no mass lesion. There is no mass effect or midline shift. Vascular: No hyperdense vessel or unexpected calcification. Skull: Normal. Negative for fracture or focal lesion. Sinuses/Orbits: The paranasal sinuses are clear. The globes and orbits are unremarkable. Other: The patient is status post right canal wall up mastoidectomy. The mastoid bowl is clear. ASPECTS [facility] Stroke Program Early CT Score) - Ganglionic level infarction (caudate, lentiform nuclei, internal capsule, insula, M1-M3 cortex): 7 - Supraganglionic infarction (M4-M6 cortex): 3 Total score (0-10 with 10 being normal): 10 IMPRESSION: No acute intracranial pathology. Findings communicated with Dr. Derry Lory at 10:45 am. Electronically Signed   By: Lesia Hausen M.D.   On: 10/24/2022 10:47    EKG: Independently reviewed.  Sinus rhythm at 66 bpm.  Nonspecific T wave flattening.  Overall morphology similar to previous.  Assessment/Plan Principal Problem:   Acute CVA (cerebrovascular accident) (HCC) Active Problems:   OSA on CPAP   Hypertension   Hyperlipidemia   Depression   Diabetes mellitus, type II (HCC)   Acute CVA > Presented with new onset right-sided weakness and some facial droop.  The severity symptoms appear to wax and wane in route and in the ED.  There was some initial thought that there could be a functional component, but MRI was performed and was positive for acute infarct. > Neurology consulted in the ED and are following - Appreciate neurology recommendations - Allow for permissive HTN (systolic < 220 and diastolic < 120) -  Daily aspirin and Plavix for 21 days, antiplatelet agent following this per neurology recommendations - Continue home rosuvastatin - Echocardiogram  - CTA head & neck  - A1C  - Lipid panel  - Tele monitoring  - SLP eval - PT/OT  Hypertension - Holding home antihypertensives in the setting of permissive hypertension as above  Hyperlipidemia - Continue home atorvastatin  Depression - Continue home sertraline  Diabetes - SSI  OSA - History of CPAP, but also non-adherence  DVT prophylaxis: Lovenox Code Status:   Full Family Communication:  None on admission, stats she has updated her friend.  Disposition Plan:   Patient is from:  Home  Anticipated DC to:  Home  Anticipated DC date:  1 to 2 days  Anticipated DC barriers: None  Consults called:  Neurology Admission status:  Observation, telemetry  Severity of Illness: The appropriate patient status for this patient is OBSERVATION. Observation status is judged to be reasonable and necessary in order to provide the required intensity of service to ensure the patient's safety. The patient's presenting symptoms, physical exam findings, and initial radiographic and laboratory data in the context of their medical condition is felt to place them at decreased risk for further clinical deterioration. Furthermore, it is anticipated that the patient will be medically stable for discharge from the hospital within 2 midnights of admission.    Synetta Fail MD [facility]  How to contact the [facility] Attending or Consulting provider 7A - 7P or covering provider during after hours 7P -7A, for this patient?   Check the care team in [facility] and look for a) attending/consulting [facility] provider listed and b) the [facility] team listed Log into [email or web address] and use [employer] universal password to access. If you do not have the password, please contact the hospital operator. Locate the [facility] provider you are looking for under [facility] and  page to a number that you can be directly reached. If you still have difficulty reaching the provider, please page the [facility] (Director on Call) for the Hospitalists listed on amion for assistance.  10/24/2022, 12:53 PM

## 2022-10-24 NOTE — ED Notes (Signed)
ED TO INPATIENT HANDOFF REPORT  ED Nurse Name and Phone #: Marcelino Duster [phone]  S Name/Age/Gender Karen Foley 57 y.o. female Room/Bed: 008C/008C  Code Status   Code Status: Full Code  Home/SNF/Other Home Patient oriented to: self, place, time, and situation Is this baseline? Yes   Triage Complete: Triage complete  Chief Complaint Acute CVA (cerebrovascular accident) [facility]) [I63.9]  Triage Note Pt BIB [facility] from work due to right sided weakness noticed by co-workers.  LKW 4540 today 10/24/22.  Pt has similar experience ago 10/07/22 and was taken to [facility]. VSS. 18g left AC   Allergies Allergies  Allergen Reactions   Latex Swelling and Anxiety   Clindamycin/Lincomycin Hives and Itching   Metformin Other (See Comments)    GI upset     Level of Care/Admitting Diagnosis ED Disposition     ED Disposition  Admit   Condition  --   Comment  Hospital Area: [facility] [[id number]]  Level of Care: Telemetry Medical [104]  May place patient in observation at [facility] or [facility] if equivalent level of care is available:: No  Covid Evaluation: Asymptomatic - no recent exposure (last 10 days) testing not required  Diagnosis: Acute CVA (cerebrovascular accident) [facility]) [[id number]]  Admitting Physician: Synetta Fail [[id number]]  Attending Physician: Synetta Fail [[id number]]          B Medical/Surgery History Past Medical History:  Diagnosis Date   Anxiety    Asthma    Colon polyps    Depression    Diabetes mellitus    Endometriosis    s/p LOA 2003   GERD (gastroesophageal reflux disease)    Hx of varicella    Hyperlipidemia    Hypertension    OSA on CPAP    Scoliosis    Seizures (HCC)    Thyroid disease    Past Surgical History:  Procedure Laterality Date   ABDOMINAL HYSTERECTOMY  2003   TAH BSO endometriosis   BREAST BIOPSY  2008   laproscopy  [phone]   MASTOIDECTOMY Right 2011   MASTOIDECTOMY  2011   OVARIAN CYST  REMOVAL  2011     A IV Location/Drains/Wounds Patient Lines/Drains/Airways Status     Active Line/Drains/Airways     Name Placement date Placement time Site Days   Peripheral IV 10/24/22 18 G Left Antecubital 10/24/22  0955  Antecubital  less than 1            Intake/Output Last 24 hours No intake or output data in the 24 hours ending 10/24/22 1347  Labs/Imaging Results for orders placed or performed during the hospital encounter of 10/24/22 (from the past 48 hour(s))  CBG monitoring, ED     Status: Abnormal   Collection Time: 10/24/22 10:15 AM  Result Value Ref Range   Glucose-Capillary 152 (H) 70 - 99 mg/dL    Comment: Glucose reference range applies only to samples taken after fasting for at least 8 hours.  Protime-INR     Status: None   Collection Time: 10/24/22 10:17 AM  Result Value Ref Range   Prothrombin Time 13.8 11.4 - 15.2 seconds   INR 1.0 0.8 - 1.2    Comment: (NOTE) INR goal varies based on device and disease states. Performed at [facility], [address], [place], [place] [address]   APTT     Status: None   Collection Time: 10/24/22 10:17 AM  Result Value Ref Range   aPTT 27 24 - 36 seconds  Comment: Performed at [facility], [address] [place], [place] [address]  CBC     Status: None   Collection Time: 10/24/22 10:17 AM  Result Value Ref Range   WBC 8.4 4.0 - 10.5 K/uL   RBC 4.64 3.87 - 5.11 MIL/uL   Hemoglobin 14.0 12.0 - 15.0 g/dL   HCT 60.4 54.0 - 98.1 %   MCV 92.7 80.0 - 100.0 fL   MCH 30.2 26.0 - 34.0 pg   MCHC 32.6 30.0 - 36.0 g/dL   RDW 19.1 47.8 - 29.5 %   Platelets 212 150 - 400 K/uL   nRBC 0.0 0.0 - 0.2 %    Comment: Performed at [facility], [address] [place], [place] [address]  Differential     Status: None   Collection Time: 10/24/22 10:17 AM  Result Value Ref Range   Neutrophils Relative % 66 %   Neutro Abs 5.5 1.7 - 7.7 K/uL   Lymphocytes Relative 24 %   Lymphs Abs 2.0 0.7 - 4.0 K/uL    Monocytes Relative 8 %   Monocytes Absolute 0.6 0.1 - 1.0 K/uL   Eosinophils Relative 1 %   Eosinophils Absolute 0.1 0.0 - 0.5 K/uL   Basophils Relative 1 %   Basophils Absolute 0.1 0.0 - 0.1 K/uL   Immature Granulocytes 0 %   Abs Immature Granulocytes 0.03 0.00 - 0.07 K/uL    Comment: Performed at [facility], [address], [place], [place] [address]  Comprehensive metabolic panel     Status: Abnormal   Collection Time: 10/24/22 10:17 AM  Result Value Ref Range   Sodium 139 135 - 145 mmol/L   Potassium 3.9 3.5 - 5.1 mmol/L   Chloride 108 98 - 111 mmol/L   CO2 22 22 - 32 mmol/L   Glucose, Bld 140 (H) 70 - 99 mg/dL    Comment: Glucose reference range applies only to samples taken after fasting for at least 8 hours.   BUN 12 6 - 20 mg/dL   Creatinine, Ser 4.69 0.44 - 1.00 mg/dL   Calcium 8.8 (L) 8.9 - 10.3 mg/dL   Total Protein 6.9 6.5 - 8.1 g/dL   Albumin 4.0 3.5 - 5.0 g/dL   AST 25 15 - 41 U/L   ALT 24 0 - 44 U/L   Alkaline Phosphatase 92 38 - 126 U/L   Total Bilirubin 1.1 0.3 - 1.2 mg/dL   GFR, Estimated >62 >95 mL/min    Comment: (NOTE) Calculated using the CKD-EPI Creatinine Equation (2021)    Anion gap 9 5 - 15    Comment: Performed at [facility], [address], [place], [place] [address]  Ethanol     Status: None   Collection Time: 10/24/22 10:17 AM  Result Value Ref Range   Alcohol, Ethyl (B) <10 <10 mg/dL    Comment: (NOTE) Lowest detectable limit for serum alcohol is 10 mg/dL.  For medical purposes only. Performed at [facility], [address], [place], [place] [address]   I-stat chem 8, ED     Status: Abnormal   Collection Time: 10/24/22 10:24 AM  Result Value Ref Range   Sodium 141 135 - 145 mmol/L   Potassium 4.0 3.5 - 5.1 mmol/L   Chloride 106 98 - 111 mmol/L   BUN 12 6 - 20 mg/dL   Creatinine, Ser 0.27 0.44 - 1.00 mg/dL   Glucose, Bld 253 (H) 70 - 99  mg/dL    Comment: Glucose reference range applies only to samples taken  after fasting for at least 8 hours.   Calcium, Ion 1.04 (L) 1.15 - 1.40 mmol/L   TCO2 27 22 - 32 mmol/L   Hemoglobin 14.3 12.0 - 15.0 g/dL   HCT 16.1 09.6 - 04.5 %   MR BRAIN WO CONTRAST  Result Date: 10/24/2022 CLINICAL DATA:  Neuro deficit with acute stroke suspected. Right-sided weakness noticed by coworkers. EXAM: MRI HEAD WITHOUT CONTRAST TECHNIQUE: Multiplanar, multiecho pulse sequences of the brain and surrounding structures were obtained without intravenous contrast. COMPARISON:  Brain MRI 10/25/2009 FINDINGS: Brain: Small focus of restricted diffusion in the low left internal capsule. Small FLAIR hyperintensities in the cerebral white matter and right cerebellum attributed to chronic small vessel ischemia given the patient's history. Brain volume is normal. No hydrocephalus or masslike finding. Vascular: Normal flow voids. Prominent arachnoid granulation along the right transverse sigmoid dural sinus junction, stable from prior brain MRI. Skull and upper cervical spine: No focal marrow lesion. 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 Unremarkable cavernous sinus region. Chronic soft tissue nodule at the superior and medial right orbit. IMPRESSION: 1. 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 2. Chronic white matter disease that has progressed from 2011. Electronically Signed   By: Tiburcio Pea M.D.   On: 10/24/2022 11:53   CT HEAD CODE STROKE WO CONTRAST  Result Date: 10/24/2022 CLINICAL DATA:  Code stroke.  Right-sided deficits. EXAM: CT HEAD WITHOUT CONTRAST TECHNIQUE: Contiguous axial images were obtained from the base of the skull through the vertex without intravenous contrast. RADIATION DOSE REDUCTION: This exam was performed according to the departmental dose-optimization program which includes automated exposure control, adjustment of the mA and/or kV according to patient size and/or use of iterative reconstruction technique. COMPARISON:   Brain MRI 10/08/2022 FINDINGS: Brain: There is no acute intracranial hemorrhage, extra-axial fluid collection, or acute infarct. Parenchymal volume is normal. The ventricles are normal in size. Gray-white differentiation is preserved. Scattered small foci of hypodensity in the supratentorial white matter are nonspecific but likely reflect sequela of chronic small-vessel ischemic change The pituitary and suprasellar region are normal. There is no mass lesion. There is no mass effect or midline shift. Vascular: No hyperdense vessel or unexpected calcification. Skull: Normal. Negative for fracture or focal lesion. Sinuses/Orbits: The paranasal sinuses are clear. The globes and orbits are unremarkable. Other: The patient is status post right canal wall up mastoidectomy. The mastoid bowl is clear. ASPECTS [facility] Stroke Program Early CT Score) - Ganglionic level infarction (caudate, lentiform nuclei, internal capsule, insula, M1-M3 cortex): 7 - Supraganglionic infarction (M4-M6 cortex): 3 Total score (0-10 with 10 being normal): 10 IMPRESSION: No acute intracranial pathology. Findings communicated with Dr. Derry Lory at 10:45 am. Electronically Signed   By: Lesia Hausen M.D.   On: 10/24/2022 10:47    Pending Labs Unresulted Labs (From admission, onward)     Start     Ordered   10/31/22 0500  Creatinine, serum  (enoxaparin (LOVENOX)    CrCl >/= 30 ml/min)  Weekly,   R     Comments: while on enoxaparin therapy    10/24/22 1252   10/25/22 0500  Lipid panel  (Labs)  Tomorrow morning,   R       Comments: Fasting    10/24/22 1252   10/25/22 0500  Hemoglobin A1c  (Labs)  Tomorrow morning,   R       Comments:  To assess prior glycemic control    10/24/22 1252   10/25/22 0500  CBC  (Labs)  Tomorrow morning,   R        10/24/22 1252   10/25/22 0500  Comprehensive metabolic panel  (Labs)  Tomorrow morning,   R        10/24/22 1252   10/24/22 1248  HIV Antibody (routine testing w rflx)  (HIV Antibody (Routine  testing w reflex) panel)  Once,   R        10/24/22 1252            Vitals/Pain Today's Vitals   10/24/22 1000 10/24/22 1033  BP:  (!) 139/103  Pulse:  76  Resp:  (!) 21  Temp:  98.3 F (36.8 C)  TempSrc:  Oral  SpO2:  99%  Weight: 86 kg 79.4 kg  Height:  5\' 3"  (1.6 m)  PainSc:  0-No pain    Isolation Precautions No active isolations  Medications Medications   stroke: early stages of recovery book (has no administration in time range)  acetaminophen (TYLENOL) tablet 650 mg (has no administration in time range)    Or  acetaminophen (TYLENOL) 160 MG/5ML solution 650 mg (has no administration in time range)    Or  acetaminophen (TYLENOL) suppository 650 mg (has no administration in time range)  senna-docusate (Senokot-S) tablet 1 tablet (has no administration in time range)  enoxaparin (LOVENOX) injection 40 mg (has no administration in time range)  aspirin chewable tablet 81 mg (has no administration in time range)  clopidogrel (PLAVIX) tablet 75 mg (has no administration in time range)  insulin aspart (novoLOG) injection 0-15 Units (has no administration in time range)  sertraline (ZOLOFT) tablet 50 mg (has no administration in time range)  atorvastatin (LIPITOR) tablet 20 mg (has no administration in time range)  sodium chloride flush (NS) 0.9 % injection 3 mL (3 mLs Intravenous Given 10/24/22 1010)  prochlorperazine (COMPAZINE) injection 10 mg (10 mg Intravenous Given 10/24/22 1041)  diphenhydrAMINE (BENADRYL) injection 25 mg (25 mg Intravenous Given 10/24/22 1041)  ketorolac (TORADOL) 30 MG/ML injection 30 mg (30 mg Intravenous Given 10/24/22 1040)  aspirin chewable tablet 81 mg (81 mg Oral Given 10/24/22 1342)  clopidogrel (PLAVIX) tablet 75 mg (75 mg Oral Given 10/24/22 1342)    Mobility walks     Focused Assessments Neuro Assessment Handoff:  Swallow screen pass? Yes    NIH Stroke Scale  Dizziness Present: No Headache Present: No Interval: Other (Comment) Level  of Consciousness (1a.)   : Alert, keenly responsive LOC Questions (1b. )   : Answers both questions correctly LOC Commands (1c. )   : Performs both tasks correctly Best Gaze (2. )  : Normal Visual (3. )  : No visual loss Facial Palsy (4. )    : Normal symmetrical movements Motor Arm, Left (5a. )   : No drift Motor Arm, Right (5b. ) : No drift Motor Leg, Left (6a. )  : No drift Motor Leg, Right (6b. ) : No drift Limb Ataxia (7. ): Absent Sensory (8. )  : Normal, no sensory loss Best Language (9. )  : No aphasia Dysarthria (10. ): Normal Extinction/Inattention (11.)   : No Abnormality Complete NIHSS TOTAL: 0 Last date known well: 10/24/22 Last time known well: 0915 Neuro Assessment:   Neuro Checks:   Initial (10/24/22 1030)  Has TPA been given? No If patient is a Neuro Trauma and patient is going to OR before floor  call report to 4N Charge nurse: [phone] or [phone]   R Recommendations: See Admitting Provider Note  Report given to:   Additional Notes:

## 2022-10-25 ENCOUNTER — Other Ambulatory Visit: Payer: Self-pay | Admitting: Neurology

## 2022-10-25 ENCOUNTER — Observation Stay (HOSPITAL_BASED_OUTPATIENT_CLINIC_OR_DEPARTMENT_OTHER): Payer: 59

## 2022-10-25 DIAGNOSIS — I6389 Other cerebral infarction: Secondary | ICD-10-CM | POA: Diagnosis not present

## 2022-10-25 DIAGNOSIS — E1169 Type 2 diabetes mellitus with other specified complication: Secondary | ICD-10-CM | POA: Diagnosis not present

## 2022-10-25 DIAGNOSIS — I639 Cerebral infarction, unspecified: Secondary | ICD-10-CM | POA: Diagnosis not present

## 2022-10-25 DIAGNOSIS — I10 Essential (primary) hypertension: Secondary | ICD-10-CM | POA: Diagnosis not present

## 2022-10-25 DIAGNOSIS — E785 Hyperlipidemia, unspecified: Secondary | ICD-10-CM | POA: Diagnosis not present

## 2022-10-25 LAB — ECHOCARDIOGRAM COMPLETE
AR max vel: 2.44 cm2
AV Area VTI: 2.53 cm2
AV Area mean vel: 2.11 cm2
AV Mean grad: 6 mmHg
AV Peak grad: 8.5 mmHg
Ao pk vel: 1.46 m/s
Area-P 1/2: 3.28 cm2
Height: 63 in
S' Lateral: 2.7 cm
Weight: 2800 oz

## 2022-10-25 LAB — COMPREHENSIVE METABOLIC PANEL
ALT: 24 U/L (ref 0–44)
AST: 22 U/L (ref 15–41)
Albumin: 3.8 g/dL (ref 3.5–5.0)
Alkaline Phosphatase: 87 U/L (ref 38–126)
Anion gap: 10 (ref 5–15)
BUN: 16 mg/dL (ref 6–20)
CO2: 22 mmol/L (ref 22–32)
Calcium: 9.1 mg/dL (ref 8.9–10.3)
Chloride: 108 mmol/L (ref 98–111)
Creatinine, Ser: 0.82 mg/dL (ref 0.44–1.00)
GFR, Estimated: >60 mL/min (ref >60)
Glucose, Bld: 81 mg/dL (ref 70–99)
Potassium: 3.5 mmol/L (ref 3.5–5.1)
Sodium: 140 mmol/L (ref 135–145)
Total Bilirubin: 0.7 mg/dL (ref 0.3–1.2)
Total Protein: 6.8 g/dL (ref 6.5–8.1)

## 2022-10-25 LAB — HIV ANTIBODY (ROUTINE TESTING W REFLEX): HIV Screen 4th Generation wRfx: NONREACTIVE

## 2022-10-25 LAB — LIPID PANEL
Cholesterol: 150 mg/dL (ref 0–200)
HDL: 52 mg/dL (ref >40)
LDL Cholesterol: 83 mg/dL (ref 0–99)
Total CHOL/HDL Ratio: 2.9 RATIO
Triglycerides: 76 mg/dL (ref <150)
VLDL: 15 mg/dL (ref 0–40)

## 2022-10-25 LAB — GLUCOSE, CAPILLARY
Glucose-Capillary: 83 mg/dL (ref 70–99)
Glucose-Capillary: 92 mg/dL (ref 70–99)

## 2022-10-25 LAB — CBC
HCT: 41.1 % (ref 36.0–46.0)
Hemoglobin: 13.7 g/dL (ref 12.0–15.0)
MCH: 30.9 pg (ref 26.0–34.0)
MCHC: 33.3 g/dL (ref 30.0–36.0)
MCV: 92.6 fL (ref 80.0–100.0)
Platelets: 222 10*3/uL (ref 150–400)
RBC: 4.44 MIL/uL (ref 3.87–5.11)
RDW: 12.1 % (ref 11.5–15.5)
WBC: 7.9 10*3/uL (ref 4.0–10.5)
nRBC: 0 % (ref 0.0–0.2)

## 2022-10-25 LAB — HEMOGLOBIN A1C
Hgb A1c MFr Bld: 11.1 % — ABNORMAL HIGH (ref 4.8–5.6)
Mean Plasma Glucose: 271.87 mg/dL

## 2022-10-25 MED ORDER — CLOPIDOGREL BISULFATE 75 MG PO TABS: 75.0000 mg | ORAL_TABLET | Freq: Every day | ORAL | 0 refills | Status: AC

## 2022-10-25 MED ORDER — ASPIRIN 81 MG PO CHEW: 81.0000 mg | CHEWABLE_TABLET | Freq: Every day | ORAL | 11 refills | Status: AC

## 2022-10-25 MED ORDER — ATORVASTATIN CALCIUM 40 MG PO TABS: 40.0000 mg | ORAL_TABLET | Freq: Every day | ORAL | 1 refills | Status: AC

## 2022-10-25 NOTE — Progress Notes (Addendum)
STROKE TEAM PROGRESS NOTE   INTERVAL HISTORY Patient is seen in her room with no family at the bedside.  She originally presented to the hospital with right-sided weakness and facial droop which waxed and waned.  On MRI, she was found to have a small infarct in the left internal capsule.  CT angiogram shows no significant large vessel stenosis or occlusion.  Vitals:   10/25/22 0000 10/25/22 0200 10/25/22 0400 10/25/22 0802  BP: 117/78  (!) 138/96 (!) 140/90  Pulse: 63  70 88  Resp: 14 15 17 18   Temp: 98 F (36.7 C)  98.1 F (36.7 C) 97.9 F (36.6 C)  TempSrc: Oral  Oral Oral  SpO2:      Weight:      Height:       CBC:  Recent Labs  Lab 10/24/22 1017 10/24/22 1024 10/25/22 0614  WBC 8.4  --  7.9  NEUTROABS 5.5  --   --   HGB 14.0 14.3 13.7  HCT 43.0 42.0 41.1  MCV 92.7  --  92.6  PLT 212  --  222   Basic Metabolic Panel:  Recent Labs  Lab 10/24/22 1017 10/24/22 1024 10/25/22 0614  NA 139 141 140  K 3.9 4.0 3.5  CL 108 106 108  CO2 22  --  22  GLUCOSE 140* 138* 81  BUN 12 12 16   CREATININE 0.91 0.80 0.82  CALCIUM 8.8*  --  9.1   Lipid Panel:  Recent Labs  Lab 10/25/22 0614  CHOL 150  TRIG 76  HDL 52  CHOLHDL 2.9  VLDL 15  LDLCALC 83   HgbA1c:  Recent Labs  Lab 10/25/22 0614  HGBA1C 11.1*   Urine Drug Screen: No results for input(s): "LABOPIA", "COCAINSCRNUR", "LABBENZ", "AMPHETMU", "THCU", "LABBARB" in the last 168 hours.  Alcohol Level  Recent Labs  Lab 10/24/22 1017  ETH <10    IMAGING past 24 hours ECHOCARDIOGRAM COMPLETE  Result Date: 10/25/2022    ECHOCARDIOGRAM REPORT   Patient Name:   Karen Foley Date of Exam: 10/25/2022 Medical Rec #:  [id number]        Height:       63.0 in Accession #:    [id number]       Weight:       175.0 lb Date of Birth:  [birth year]       BSA:          1.827 m Patient Age:    56 years         BP:           138/96 mmHg Patient Gender: F                HR:           72 bpm. Exam Location:  Inpatient Procedure: 2D  Echo, Cardiac Doppler and Color Doppler Indications:    Stroke  History:        Patient has no prior history of Echocardiogram examinations.                 Stroke; Risk Factors:Hypertension, Diabetes and HLD.  Sonographer:    Lucy Antigua Referring Phys: [id number] Werner Lean GHIMIRE IMPRESSIONS  1. Left ventricular ejection fraction, by estimation, is 60 to 65%. The left ventricle has normal function. The left ventricle has no regional wall motion abnormalities. Left ventricular diastolic parameters were normal.  2. Right ventricular systolic function is normal. The right ventricular size is normal.  3. The  mitral valve is normal in structure. No evidence of mitral valve regurgitation. No evidence of mitral stenosis.  4. The aortic valve is tricuspid. There is mild calcification of the aortic valve. Aortic valve regurgitation is not visualized. No aortic stenosis is present. Aortic valve area, by VTI measures 2.53 cm. Aortic valve mean gradient measures 6.0 mmHg. Aortic valve Vmax measures 1.46 m/s.  5. The inferior vena cava is normal in size with greater than 50% respiratory variability, suggesting right atrial pressure of 3 mmHg. FINDINGS  Left Ventricle: Left ventricular ejection fraction, by estimation, is 60 to 65%. The left ventricle has normal function. The left ventricle has no regional wall motion abnormalities. The left ventricular internal cavity size was normal in size. There is  no left ventricular hypertrophy. Left ventricular diastolic parameters were normal. Right Ventricle: The right ventricular size is normal. No increase in right ventricular wall thickness. Right ventricular systolic function is normal. Left Atrium: Left atrial size was normal in size. Right Atrium: Right atrial size was normal in size. Pericardium: There is no evidence of pericardial effusion. Mitral Valve: The mitral valve is normal in structure. No evidence of mitral valve regurgitation. No evidence of mitral valve stenosis.  Tricuspid Valve: The tricuspid valve is normal in structure. Tricuspid valve regurgitation is trivial. No evidence of tricuspid stenosis. Aortic Valve: The aortic valve is tricuspid. There is mild calcification of the aortic valve. Aortic valve regurgitation is not visualized. No aortic stenosis is present. Aortic valve mean gradient measures 6.0 mmHg. Aortic valve peak gradient measures 8.5 mmHg. Aortic valve area, by VTI measures 2.53 cm. Pulmonic Valve: The pulmonic valve was normal in structure. Pulmonic valve regurgitation is not visualized. No evidence of pulmonic stenosis. Aorta: The aortic root is normal in size and structure. Venous: The inferior vena cava is normal in size with greater than 50% respiratory variability, suggesting right atrial pressure of 3 mmHg. IAS/Shunts: No atrial level shunt detected by color flow Doppler.  LEFT VENTRICLE PLAX 2D LVIDd:         3.90 cm   Diastology LVIDs:         2.70 cm   LV e' medial:    9.36 cm/s LV PW:         1.00 cm   LV E/e' medial:  9.6 LV IVS:        1.10 cm   LV e' lateral:   11.70 cm/s LVOT diam:     2.10 cm   LV E/e' lateral: 7.7 LV SV:         68 LV SV Index:   37 LVOT Area:     3.46 cm  RIGHT VENTRICLE RV S prime:     7.83 cm/s TAPSE (M-mode): 2.5 cm LEFT ATRIUM             Index        RIGHT ATRIUM          Index LA Vol (A2C):   22.7 ml 12.43 ml/m  RA Area:     8.40 cm LA Vol (A4C):   23.0 ml 12.59 ml/m  RA Volume:   16.20 ml 8.87 ml/m LA Biplane Vol: 24.6 ml 13.47 ml/m  AORTIC VALVE AV Area (Vmax):    2.44 cm AV Area (Vmean):   2.11 cm AV Area (VTI):     2.53 cm AV Vmax:           146.00 cm/s AV Vmean:  115.000 cm/s AV VTI:            0.270 m AV Peak Grad:      8.5 mmHg AV Mean Grad:      6.0 mmHg LVOT Vmax:         103.00 cm/s LVOT Vmean:        70.100 cm/s LVOT VTI:          0.197 m LVOT/AV VTI ratio: 0.73  AORTA Ao Root diam: 3.00 cm Ao Asc diam:  3.20 cm MITRAL VALVE MV Area (PHT): 3.28 cm    SHUNTS MV Decel Time: 231 msec     Systemic VTI:  0.20 m MV E velocity: 90.10 cm/s  Systemic Diam: 2.10 cm MV A velocity: 83.30 cm/s MV E/A ratio:  1.08 Arvilla Meres MD Electronically signed by Arvilla Meres MD Signature Date/Time: 10/25/2022/9:02:39 AM    Final     PHYSICAL EXAM General: Alert, well-nourished, well-developed pleasant middle-age lady  in no acute distress Respiratory: Regular, unlabored respirations on room air  NEURO:  Mental Status: AA&Ox3  Speech/Language: speech is without dysarthria or aphasia.    Cranial Nerves:  II: PERRL. Visual fields full.  III, IV, VI: EOMI. Eyelids elevate symmetrically.  V: Sensation is intact to light touch and symmetrical to face.  VII: Slight right facial droop VIII: hearing intact to voice. IX, X: Phonation is normal.  XII: tongue is midline without fasciculations. Motor: 5/5 strength to all muscle groups tested with subtle weakness of the right hand grip Tone: is normal and bulk is normal Sensation- Intact to light touch bilaterally.  Coordination: FTN intact bilaterally.No drift.  Gait- deferred     ASSESSMENT/PLAN Karen Foley is a 57 y.o. female with history of anxiety, depression, diabetes, hypertension, hyperlipidemia, sleep apnea on CPAP and seizures as a child presenting with right-sided weakness and facial droop which waxed and waned.  On MRI, she was found to have a small infarct in the left internal capsule.  Stroke:  left internal capsule stroke Etiology: Small vessel disease Code Stroke CT head No acute abnormality. ASPECTS 10.    CTA head & neck no LVO or hemodynamically significant stenosis MRI small acute infarct in left internal capsule, chronic white matter disease 2D Echo EF 60 to 65%, normal left atrial size, no atrial level shunt LDL 83 HgbA1c 11.1 VTE prophylaxis -Lovenox    Diet   Diet regular Fluid consistency: Thin   No antithrombotic prior to admission, now on aspirin 81 mg daily and clopidogrel 75 mg daily for 3 weeks  followed by aspirin alone. Therapy recommendations: No PT/OT follow-up Disposition: Home  Hypertension Home meds: Amlodipine 10 mg daily Stable Permissive hypertension (OK if < 220/120) but gradually normalize in 5-7 days Long-term BP goal normotensive  Hyperlipidemia Home meds: Atorvastatin 20 mg daily, resumed in hospital LDL 83, goal < 70 Continue statin at discharge  Diabetes type II Uncontrolled Home meds: Jardiance 10 mg daily HgbA1c 11.1, goal < 7.0 CBGs Recent Labs    10/24/22 2345 10/25/22 0802 10/25/22 1218  GLUCAP 93 83 92  Recommend close follow-up with PCP for better diabetes control SSI  Other Stroke Risk Factors Obesity, Body mass index is 31 kg/m., BMI >/= 30 associated with increased stroke risk, recommend weight loss, diet and exercise as appropriate   Other Active Problems None  Hospital day # 0  Cortney E Ernestina Columbia , MSN, AGACNP-BC [facility] See Amion for schedule and pager information 10/25/2022 3:34 PM  STROKE MD NOTE :  I have personally obtained history,examined this patient, reviewed notes, independently viewed imaging studies, participated in medical decision making and plan of care.ROS completed by me personally and pertinent positives fully documented  I have made any additions or clarifications directly to the above note. Agree with note above.  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  Recommend aspirin Plavix for 3 weeks followed by aspirin alone.  Aggressive risk factor modification.  Patient counseled to be compliant with her diet and to lose weight and to follow-up with primary physician for aggressive treatment of her diabetes.  Greater than 50% time during this 50-minute visit was spent in counseling and coordination of care about her lacunar stroke and discussion about stroke evaluation, prevention and treatment and answering questions.  Discussed  with Dr.Ghimire  Delia Heady, MD Medical Director [facility] Pager: [phone] 10/25/2022 3:36 PM   To contact Stroke Continuity provider, please refer to [email or web address]. After hours, contact General Neurology

## 2022-10-25 NOTE — Discharge Summary (Signed)
PATIENT DETAILS Name: Karen Foley Age: 57 y.o. Sex: female Date of Birth: 1966/05/16 MRN: [id number]. Admitting Physician: Synetta Fail, MD PCP:Pcp, No  Admit Date: 10/24/2022 Discharge date: 10/25/2022  Recommendations for Outpatient Follow-up:  Follow up with PCP in 1-2 weeks Please obtain CMP/CBC in one week Please ensure follow-up with stroke clinic. Aspirin/Plavix x 3 weeks, followed by aspirin alone.   Admitted From:  Home  Disposition: Home   Discharge Condition: good  CODE STATUS:   Code Status: Full Code   Diet recommendation:  Diet Order             Diet - low sodium heart healthy           Diet Carb Modified           Diet regular Fluid consistency: Thin  Diet effective now                    Brief Summary: 57 year old with history of HTN, HLD, DM-2, presented with waxing/waning right-sided weakness-found to have acute CVA and subsequently admitted to the hospitalist service  Stroke workup 6/6>> MRI brain: Infarct low left internal capsule. 6/6>> CT angio head/neck: No LVO or significant stenosis. 6/7>> echo: EF 60-65% 6/7>> A1c: 11.1 6/7>> LDL: 83  Brief Hospital Course: Acute CVA Had intermittent right-sided weakness that has since completely resolved Workup as above Discussed with stroke MD-aspirin/Plavix x 3 weeks followed by aspirin alone  DM-2 A1c significantly elevated as above Per patient-she was aware of significant elevated A1c-her PCP just started on Jardiance.  She was not on any medications prior to being started on Jardiance.  In the distant past-she was on Ozempic at 1 point-I am asked her to talk to her primary care practitioner to see if she can go back a GLP-1 inhibitor.  She does not try metformin-as as she is not being able to tolerate it due to GI upset.  HLD Lipitor-increase the dosage to high intensity dose  Depression Zoloft Appears stable  Obesity: Estimated body mass index is 31 kg/m as  calculated from the following:   Height as of this encounter: 5\' 3"  (1.6 m).   Weight as of this encounter: 79.4 kg.   Discharge Diagnoses:  Principal Problem:   Acute CVA (cerebrovascular accident) (HCC) Active Problems:   OSA on CPAP   Hypertension   Hyperlipidemia   Depression   Diabetes mellitus, type II [facility])   Discharge Instructions:  Activity:  As tolerated  Discharge Instructions     Ambulatory referral to Neurology   Complete by: As directed    An appointment is requested in approximately: 8 weeks   Call MD for:  extreme fatigue   Complete by: As directed    Call MD for:  persistant dizziness or light-headedness   Complete by: As directed    Diet - low sodium heart healthy   Complete by: As directed    Diet Carb Modified   Complete by: As directed    Discharge instructions   Complete by: As directed    Follow with Primary MD  in 1-2 weeks  Follow-up with stroke clinic -office will call you with a follow-up appointment.  Aspirin/Plavix x 3 weeks followed by aspirin alone.  Please talk to your primary care practitioner about restarting Ozempic.   Please get a complete blood count and chemistry panel checked by your Primary MD at your next visit, and again as instructed by your Primary MD.  Get Medicines reviewed  and adjusted: Please take all your medications with you for your next visit with your Primary MD  Laboratory/radiological data: Please request your Primary MD to go over all hospital tests and procedure/radiological results at the follow up, please ask your Primary MD to get all Hospital records sent to his/her office.  In some cases, they will be blood work, cultures and biopsy results pending at the time of your discharge. Please request that your primary care M.D. follows up on these results.  Also Note the following: If you experience worsening of your admission symptoms, develop shortness of breath, life threatening emergency, suicidal or  homicidal thoughts you must seek medical attention immediately by calling 911 or calling your MD immediately  if symptoms less severe.  You must read complete instructions/literature along with all the possible adverse reactions/side effects for all the Medicines you take and that have been prescribed to you. Take any new Medicines after you have completely understood and accpet all the possible adverse reactions/side effects.   Do not drive when taking Pain medications or sleeping medications (Benzodaizepines)  Do not take more than prescribed Pain, Sleep and Anxiety Medications. It is not advisable to combine anxiety,sleep and pain medications without talking with your primary care practitioner  Special Instructions: If you have smoked or chewed Tobacco  in the last 2 yrs please stop smoking, stop any regular Alcohol  and or any Recreational drug use.  Wear Seat belts while driving.  Please note: You were cared for by a hospitalist during your hospital stay. Once you are discharged, your primary care physician will handle any further medical issues. Please note that NO REFILLS for any discharge medications will be authorized once you are discharged, as it is imperative that you return to your primary care physician (or establish a relationship with a primary care physician if you do not have one) for your post hospital discharge needs so that they can reassess your need for medications and monitor your lab values.   Increase activity slowly   Complete by: As directed       Allergies as of 10/25/2022       Reactions   Latex Swelling, Anxiety   Clindamycin/lincomycin Hives, Itching   Metformin Other (See Comments)   GI upset         Medication List     STOP taking these medications    rosuvastatin 10 MG tablet Commonly known as: CRESTOR       TAKE these medications    amLODipine 10 MG tablet Commonly known as: NORVASC Take 10 mg by mouth daily.   aspirin 81 MG chewable  tablet Chew 1 tablet (81 mg total) by mouth daily.   atorvastatin 40 MG tablet Commonly known as: LIPITOR Take 1 tablet (40 mg total) by mouth daily. What changed:  medication strength how much to take   clopidogrel 75 MG tablet Commonly known as: PLAVIX Take 1 tablet (75 mg total) by mouth daily.   DULoxetine 30 MG capsule Commonly known as: Cymbalta Take 1 capsule (30 mg total) by mouth daily. Increase to  60 mg per day after 2 weeks   empagliflozin 10 MG Tabs tablet Commonly known as: JARDIANCE Take 10 mg by mouth daily.   hydrOXYzine 25 MG capsule Commonly known as: VISTARIL Take 25 mg by mouth at bedtime.   magnesium oxide 400 MG tablet Commonly known as: MAG-OX Take 400 mg by mouth daily.   ONE TOUCH ULTRA TEST test strip Generic drug: glucose blood  TEST DAILY AS DIRECTED   OneTouch Delica Lancets Fine Misc Test once daily   sertraline 50 MG tablet Commonly known as: ZOLOFT Take 50 mg by mouth at bedtime.   Turmeric 500 MG Tabs Take 1,000 mg by mouth daily.   VITAMIN D PO Take 1 capsule by mouth daily.   VITAMIN E PO Take 2 capsules by mouth daily.        Follow-up Information     Micki Riley, MD Follow up.   Specialties: Neurology, Radiology Why: Office will call with date/time, If you dont hear from them,please give them a call Contact information: [address] [place] [address] [phone]         Primary care practitioner. Schedule an appointment as soon as possible for a visit in 1 week(s).                 Allergies  Allergen Reactions   Latex Swelling and Anxiety   Clindamycin/Lincomycin Hives and Itching   Metformin Other (See Comments)    GI upset      Other Procedures/Studies: ECHOCARDIOGRAM COMPLETE  Result Date: 10/25/2022    ECHOCARDIOGRAM REPORT   Patient Name:   Karen Foley Date of Exam: 10/25/2022 Medical Rec #:  [id number]        Height:       63.0 in Accession #:    [id number]        Weight:       175.0 lb Date of Birth:  1965-07-02       BSA:          1.827 m Patient Age:    56 years         BP:           138/96 mmHg Patient Gender: F                HR:           72 bpm. Exam Location:  Inpatient Procedure: 2D Echo, Cardiac Doppler and Color Doppler Indications:    Stroke  History:        Patient has no prior history of Echocardiogram examinations.                 Stroke; Risk Factors:Hypertension, Diabetes and HLD.  Sonographer:    Lucy Antigua Referring Phys: [id number] Karen Foley IMPRESSIONS  1. Left ventricular ejection fraction, by estimation, is 60 to 65%. The left ventricle has normal function. The left ventricle has no regional wall motion abnormalities. Left ventricular diastolic parameters were normal.  2. Right ventricular systolic function is normal. The right ventricular size is normal.  3. The mitral valve is normal in structure. No evidence of mitral valve regurgitation. No evidence of mitral stenosis.  4. The aortic valve is tricuspid. There is mild calcification of the aortic valve. Aortic valve regurgitation is not visualized. No aortic stenosis is present. Aortic valve area, by VTI measures 2.53 cm. Aortic valve mean gradient measures 6.0 mmHg. Aortic valve Vmax measures 1.46 m/s.  5. The inferior vena cava is normal in size with greater than 50% respiratory variability, suggesting right atrial pressure of 3 mmHg. FINDINGS  Left Ventricle: Left ventricular ejection fraction, by estimation, is 60 to 65%. The left ventricle has normal function. The left ventricle has no regional wall motion abnormalities. The left ventricular internal cavity size was normal in size. There is  no left ventricular hypertrophy. Left ventricular diastolic parameters were normal. Right Ventricle:  The right ventricular size is normal. No increase in right ventricular wall thickness. Right ventricular systolic function is normal. Left Atrium: Left atrial size was normal in size. Right Atrium: Right  atrial size was normal in size. Pericardium: There is no evidence of pericardial effusion. Mitral Valve: The mitral valve is normal in structure. No evidence of mitral valve regurgitation. No evidence of mitral valve stenosis. Tricuspid Valve: The tricuspid valve is normal in structure. Tricuspid valve regurgitation is trivial. No evidence of tricuspid stenosis. Aortic Valve: The aortic valve is tricuspid. There is mild calcification of the aortic valve. Aortic valve regurgitation is not visualized. No aortic stenosis is present. Aortic valve mean gradient measures 6.0 mmHg. Aortic valve peak gradient measures 8.5 mmHg. Aortic valve area, by VTI measures 2.53 cm. Pulmonic Valve: The pulmonic valve was normal in structure. Pulmonic valve regurgitation is not visualized. No evidence of pulmonic stenosis. Aorta: The aortic root is normal in size and structure. Venous: The inferior vena cava is normal in size with greater than 50% respiratory variability, suggesting right atrial pressure of 3 mmHg. IAS/Shunts: No atrial level shunt detected by color flow Doppler.  LEFT VENTRICLE PLAX 2D LVIDd:         3.90 cm   Diastology LVIDs:         2.70 cm   LV e' medial:    9.36 cm/s LV PW:         1.00 cm   LV E/e' medial:  9.6 LV IVS:        1.10 cm   LV e' lateral:   11.70 cm/s LVOT diam:     2.10 cm   LV E/e' lateral: 7.7 LV SV:         68 LV SV Index:   37 LVOT Area:     3.46 cm  RIGHT VENTRICLE RV S prime:     7.83 cm/s TAPSE (M-mode): 2.5 cm LEFT ATRIUM             Index        RIGHT ATRIUM          Index LA Vol (A2C):   22.7 ml 12.43 ml/m  RA Area:     8.40 cm LA Vol (A4C):   23.0 ml 12.59 ml/m  RA Volume:   16.20 ml 8.87 ml/m LA Biplane Vol: 24.6 ml 13.47 ml/m  AORTIC VALVE AV Area (Vmax):    2.44 cm AV Area (Vmean):   2.11 cm AV Area (VTI):     2.53 cm AV Vmax:           146.00 cm/s AV Vmean:          115.000 cm/s AV VTI:            0.270 m AV Peak Grad:      8.5 mmHg AV Mean Grad:      6.0 mmHg LVOT Vmax:          103.00 cm/s LVOT Vmean:        70.100 cm/s LVOT VTI:          0.197 m LVOT/AV VTI ratio: 0.73  AORTA Ao Root diam: 3.00 cm Ao Asc diam:  3.20 cm MITRAL VALVE MV Area (PHT): 3.28 cm    SHUNTS MV Decel Time: 231 msec    Systemic VTI:  0.20 m MV E velocity: 90.10 cm/s  Systemic Diam: 2.10 cm MV A velocity: 83.30 cm/s MV E/A ratio:  1.08 Arvilla Meres MD Electronically signed by  Arvilla Meres MD Signature Date/Time: 10/25/2022/9:02:39 AM    Final    CT ANGIO HEAD NECK W WO CM  Result Date: 10/24/2022 CLINICAL DATA:  Neuro deficit, acute, stroke suspected. EXAM: CT ANGIOGRAPHY HEAD AND NECK WITH AND WITHOUT CONTRAST TECHNIQUE: Multidetector CT imaging of the head and neck was performed using the standard protocol during bolus administration of intravenous contrast. Multiplanar CT image reconstructions and MIPs were obtained to evaluate the vascular anatomy. Carotid stenosis measurements (when applicable) are obtained utilizing NASCET criteria, using the distal internal carotid diameter as the denominator. RADIATION DOSE REDUCTION: This exam was performed according to the departmental dose-optimization program which includes automated exposure control, adjustment of the mA and/or kV according to patient size and/or use of iterative reconstruction technique. CONTRAST:  75mL OMNIPAQUE IOHEXOL 350 MG/ML SOLN COMPARISON:  Head CT 10/24/2022.  MRI brain 10/24/2022. FINDINGS: CTA NECK FINDINGS Aortic arch: Three-vessel arch configuration. Arch vessel origins are patent. Right carotid system: No evidence of dissection, stenosis (50% or greater), or occlusion. Left carotid system: No evidence of dissection, stenosis (50% or greater), or occlusion. Vertebral arteries: Codominant. No evidence of dissection, stenosis (50% or greater), or occlusion. Skeleton: Unremarkable. Other neck: Right orbital venous varix. Upper chest: Unremarkable. Review of the MIP images confirms the above findings CTA HEAD FINDINGS Anterior  circulation: Intracranial ICAs are patent without stenosis or aneurysm. The proximal ACAs and MCAs are patent without stenosis or aneurysm. Distal branches are symmetric. Posterior circulation: Normal basilar artery. The SCAs, AICAs and PICAs are patent proximally. The PCAs are patent proximally without stenosis or aneurysm. Distal branches are symmetric. Venous sinuses: As permitted by contrast timing, patent. Anatomic variants: None. Review of the MIP images confirms the above findings IMPRESSION: 1. No large vessel occlusion, significant stenosis or evidence of dissection in the head or neck vessels. 2. Right orbital venous varix. Electronically Signed   By: Orvan Falconer M.D.   On: 10/24/2022 16:05   MR BRAIN WO CONTRAST  Result Date: 10/24/2022 CLINICAL DATA:  Neuro deficit with acute stroke suspected. Right-sided weakness noticed by coworkers. EXAM: MRI HEAD WITHOUT CONTRAST TECHNIQUE: Multiplanar, multiecho pulse sequences of the brain and surrounding structures were obtained without intravenous contrast. COMPARISON:  Brain MRI 10/25/2009 FINDINGS: Brain: Small focus of restricted diffusion in the low left internal capsule. Small FLAIR hyperintensities in the cerebral white matter and right cerebellum attributed to chronic small vessel ischemia given the patient's history. Brain volume is normal. No hydrocephalus or masslike finding. Vascular: Normal flow voids. Prominent arachnoid granulation along the right transverse sigmoid dural sinus junction, stable from prior brain MRI. Skull and upper cervical spine: No focal marrow lesion. 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 Unremarkable cavernous sinus region. Chronic soft tissue nodule at the superior and medial right orbit. IMPRESSION: 1. 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 2. Chronic white matter disease that has progressed from 2011. Electronically Signed   By: Tiburcio Pea M.D.   On:  10/24/2022 11:53   CT HEAD CODE STROKE WO CONTRAST  Result Date: 10/24/2022 CLINICAL DATA:  Code stroke.  Right-sided deficits. EXAM: CT HEAD WITHOUT CONTRAST TECHNIQUE: Contiguous axial images were obtained from the base of the skull through the vertex without intravenous contrast. RADIATION DOSE REDUCTION: This exam was performed according to the departmental dose-optimization program which includes automated exposure control, adjustment of the mA and/or kV according to patient size and/or use of iterative reconstruction technique. COMPARISON:  Brain MRI 10/08/2022 FINDINGS: Brain:  There is no acute intracranial hemorrhage, extra-axial fluid collection, or acute infarct. Parenchymal volume is normal. The ventricles are normal in size. Gray-white differentiation is preserved. Scattered small foci of hypodensity in the supratentorial white matter are nonspecific but likely reflect sequela of chronic small-vessel ischemic change The pituitary and suprasellar region are normal. There is no mass lesion. There is no mass effect or midline shift. Vascular: No hyperdense vessel or unexpected calcification. Skull: Normal. Negative for fracture or focal lesion. Sinuses/Orbits: The paranasal sinuses are clear. The globes and orbits are unremarkable. Other: The patient is status post right canal wall up mastoidectomy. The mastoid bowl is clear. ASPECTS [facility] Stroke Program Early CT Score) - Ganglionic level infarction (caudate, lentiform nuclei, internal capsule, insula, M1-M3 cortex): 7 - Supraganglionic infarction (M4-M6 cortex): 3 Total score (0-10 with 10 being normal): 10 IMPRESSION: No acute intracranial pathology. Findings communicated with Dr. Derry Lory at 10:45 am. Electronically Signed   By: Lesia Hausen M.D.   On: 10/24/2022 10:47     TODAY-DAY OF DISCHARGE:  Subjective:   Karen Foley today has no headache,no chest abdominal pain,no new weakness tingling or numbness, feels much better wants to go  home today.   Objective:   Blood pressure (!) 140/90, pulse 88, temperature 97.9 F (36.6 C), temperature source Oral, resp. rate 18, height 5\' 3"  (1.6 m), weight 79.4 kg, SpO2 100 %.  Intake/Output Summary (Last 24 hours) at 10/25/2022 0935 Last data filed at 10/24/2022 2000 Gross per 24 hour  Intake 240 ml  Output --  Net 240 ml   Filed Weights   10/24/22 1000 10/24/22 1033  Weight: 86 kg 79.4 kg    Exam: Awake Alert, Oriented *3, No new F.N deficits, Normal affect [place].AT,PERRAL Supple Neck,No JVD, No cervical lymphadenopathy appriciated.  Symmetrical Chest wall movement, Good air movement bilaterally, CTAB RRR,No Gallops,Rubs or new Murmurs, No Parasternal Heave +ve B.Sounds, Abd Soft, Non tender, No organomegaly appriciated, No rebound -guarding or rigidity. No Cyanosis, Clubbing or edema, No new Rash or bruise   PERTINENT RADIOLOGIC STUDIES: ECHOCARDIOGRAM COMPLETE  Result Date: 10/25/2022    ECHOCARDIOGRAM REPORT   Patient Name:   Karen Foley Date of Exam: 10/25/2022 Medical Rec #:  [id number]        Height:       63.0 in Accession #:    [id number]       Weight:       175.0 lb Date of Birth:  [birth year]       BSA:          1.827 m Patient Age:    56 years         BP:           138/96 mmHg Patient Gender: F                HR:           72 bpm. Exam Location:  Inpatient Procedure: 2D Echo, Cardiac Doppler and Color Doppler Indications:    Stroke  History:        Patient has no prior history of Echocardiogram examinations.                 Stroke; Risk Factors:Hypertension, Diabetes and HLD.  Sonographer:    Lucy Antigua Referring Phys: [id number] Karen Foley IMPRESSIONS  1. Left ventricular ejection fraction, by estimation, is 60 to 65%. The left ventricle has normal function. The left ventricle has no regional wall motion abnormalities. Left ventricular  diastolic parameters were normal.  2. Right ventricular systolic function is normal. The right ventricular size is normal.  3. The  mitral valve is normal in structure. No evidence of mitral valve regurgitation. No evidence of mitral stenosis.  4. The aortic valve is tricuspid. There is mild calcification of the aortic valve. Aortic valve regurgitation is not visualized. No aortic stenosis is present. Aortic valve area, by VTI measures 2.53 cm. Aortic valve mean gradient measures 6.0 mmHg. Aortic valve Vmax measures 1.46 m/s.  5. The inferior vena cava is normal in size with greater than 50% respiratory variability, suggesting right atrial pressure of 3 mmHg. FINDINGS  Left Ventricle: Left ventricular ejection fraction, by estimation, is 60 to 65%. The left ventricle has normal function. The left ventricle has no regional wall motion abnormalities. The left ventricular internal cavity size was normal in size. There is  no left ventricular hypertrophy. Left ventricular diastolic parameters were normal. Right Ventricle: The right ventricular size is normal. No increase in right ventricular wall thickness. Right ventricular systolic function is normal. Left Atrium: Left atrial size was normal in size. Right Atrium: Right atrial size was normal in size. Pericardium: There is no evidence of pericardial effusion. Mitral Valve: The mitral valve is normal in structure. No evidence of mitral valve regurgitation. No evidence of mitral valve stenosis. Tricuspid Valve: The tricuspid valve is normal in structure. Tricuspid valve regurgitation is trivial. No evidence of tricuspid stenosis. Aortic Valve: The aortic valve is tricuspid. There is mild calcification of the aortic valve. Aortic valve regurgitation is not visualized. No aortic stenosis is present. Aortic valve mean gradient measures 6.0 mmHg. Aortic valve peak gradient measures 8.5 mmHg. Aortic valve area, by VTI measures 2.53 cm. Pulmonic Valve: The pulmonic valve was normal in structure. Pulmonic valve regurgitation is not visualized. No evidence of pulmonic stenosis. Aorta: The aortic root is  normal in size and structure. Venous: The inferior vena cava is normal in size with greater than 50% respiratory variability, suggesting right atrial pressure of 3 mmHg. IAS/Shunts: No atrial level shunt detected by color flow Doppler.  LEFT VENTRICLE PLAX 2D LVIDd:         3.90 cm   Diastology LVIDs:         2.70 cm   LV e' medial:    9.36 cm/s LV PW:         1.00 cm   LV E/e' medial:  9.6 LV IVS:        1.10 cm   LV e' lateral:   11.70 cm/s LVOT diam:     2.10 cm   LV E/e' lateral: 7.7 LV SV:         68 LV SV Index:   37 LVOT Area:     3.46 cm  RIGHT VENTRICLE RV S prime:     7.83 cm/s TAPSE (M-mode): 2.5 cm LEFT ATRIUM             Index        RIGHT ATRIUM          Index LA Vol (A2C):   22.7 ml 12.43 ml/m  RA Area:     8.40 cm LA Vol (A4C):   23.0 ml 12.59 ml/m  RA Volume:   16.20 ml 8.87 ml/m LA Biplane Vol: 24.6 ml 13.47 ml/m  AORTIC VALVE AV Area (Vmax):    2.44 cm AV Area (Vmean):   2.11 cm AV Area (VTI):     2.53 cm AV Vmax:  146.00 cm/s AV Vmean:          115.000 cm/s AV VTI:            0.270 m AV Peak Grad:      8.5 mmHg AV Mean Grad:      6.0 mmHg LVOT Vmax:         103.00 cm/s LVOT Vmean:        70.100 cm/s LVOT VTI:          0.197 m LVOT/AV VTI ratio: 0.73  AORTA Ao Root diam: 3.00 cm Ao Asc diam:  3.20 cm MITRAL VALVE MV Area (PHT): 3.28 cm    SHUNTS MV Decel Time: 231 msec    Systemic VTI:  0.20 m MV E velocity: 90.10 cm/s  Systemic Diam: 2.10 cm MV A velocity: 83.30 cm/s MV E/A ratio:  1.08 Arvilla Meres MD Electronically signed by Arvilla Meres MD Signature Date/Time: 10/25/2022/9:02:39 AM    Final    CT ANGIO HEAD NECK W WO CM  Result Date: 10/24/2022 CLINICAL DATA:  Neuro deficit, acute, stroke suspected. EXAM: CT ANGIOGRAPHY HEAD AND NECK WITH AND WITHOUT CONTRAST TECHNIQUE: Multidetector CT imaging of the head and neck was performed using the standard protocol during bolus administration of intravenous contrast. Multiplanar CT image reconstructions and MIPs were  obtained to evaluate the vascular anatomy. Carotid stenosis measurements (when applicable) are obtained utilizing NASCET criteria, using the distal internal carotid diameter as the denominator. RADIATION DOSE REDUCTION: This exam was performed according to the departmental dose-optimization program which includes automated exposure control, adjustment of the mA and/or kV according to patient size and/or use of iterative reconstruction technique. CONTRAST:  75mL OMNIPAQUE IOHEXOL 350 MG/ML SOLN COMPARISON:  Head CT 10/24/2022.  MRI brain 10/24/2022. FINDINGS: CTA NECK FINDINGS Aortic arch: Three-vessel arch configuration. Arch vessel origins are patent. Right carotid system: No evidence of dissection, stenosis (50% or greater), or occlusion. Left carotid system: No evidence of dissection, stenosis (50% or greater), or occlusion. Vertebral arteries: Codominant. No evidence of dissection, stenosis (50% or greater), or occlusion. Skeleton: Unremarkable. Other neck: Right orbital venous varix. Upper chest: Unremarkable. Review of the MIP images confirms the above findings CTA HEAD FINDINGS Anterior circulation: Intracranial ICAs are patent without stenosis or aneurysm. The proximal ACAs and MCAs are patent without stenosis or aneurysm. Distal branches are symmetric. Posterior circulation: Normal basilar artery. The SCAs, AICAs and PICAs are patent proximally. The PCAs are patent proximally without stenosis or aneurysm. Distal branches are symmetric. Venous sinuses: As permitted by contrast timing, patent. Anatomic variants: None. Review of the MIP images confirms the above findings IMPRESSION: 1. No large vessel occlusion, significant stenosis or evidence of dissection in the head or neck vessels. 2. Right orbital venous varix. Electronically Signed   By: Orvan Falconer M.D.   On: 10/24/2022 16:05   MR BRAIN WO CONTRAST  Result Date: 10/24/2022 CLINICAL DATA:  Neuro deficit with acute stroke suspected. Right-sided  weakness noticed by coworkers. EXAM: MRI HEAD WITHOUT CONTRAST TECHNIQUE: Multiplanar, multiecho pulse sequences of the brain and surrounding structures were obtained without intravenous contrast. COMPARISON:  Brain MRI 10/25/2009 FINDINGS: Brain: Small focus of restricted diffusion in the low left internal capsule. Small FLAIR hyperintensities in the cerebral white matter and right cerebellum attributed to chronic small vessel ischemia given the patient's history. Brain volume is normal. No hydrocephalus or masslike finding. Vascular: Normal flow voids. Prominent arachnoid granulation along the right transverse sigmoid dural sinus junction, stable from prior brain MRI. Skull  and upper cervical spine: No focal marrow lesion. 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 Unremarkable cavernous sinus region. Chronic soft tissue nodule at the superior and medial right orbit. IMPRESSION: 1. 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 2. Chronic white matter disease that has progressed from 2011. Electronically Signed   By: Tiburcio Pea M.D.   On: 10/24/2022 11:53   CT HEAD CODE STROKE WO CONTRAST  Result Date: 10/24/2022 CLINICAL DATA:  Code stroke.  Right-sided deficits. EXAM: CT HEAD WITHOUT CONTRAST TECHNIQUE: Contiguous axial images were obtained from the base of the skull through the vertex without intravenous contrast. RADIATION DOSE REDUCTION: This exam was performed according to the departmental dose-optimization program which includes automated exposure control, adjustment of the mA and/or kV according to patient size and/or use of iterative reconstruction technique. COMPARISON:  Brain MRI 10/08/2022 FINDINGS: Brain: There is no acute intracranial hemorrhage, extra-axial fluid collection, or acute infarct. Parenchymal volume is normal. The ventricles are normal in size. Gray-white differentiation is preserved. Scattered small foci of hypodensity in the  supratentorial white matter are nonspecific but likely reflect sequela of chronic small-vessel ischemic change The pituitary and suprasellar region are normal. There is no mass lesion. There is no mass effect or midline shift. Vascular: No hyperdense vessel or unexpected calcification. Skull: Normal. Negative for fracture or focal lesion. Sinuses/Orbits: The paranasal sinuses are clear. The globes and orbits are unremarkable. Other: The patient is status post right canal wall up mastoidectomy. The mastoid bowl is clear. ASPECTS [facility] Stroke Program Early CT Score) - Ganglionic level infarction (caudate, lentiform nuclei, internal capsule, insula, M1-M3 cortex): 7 - Supraganglionic infarction (M4-M6 cortex): 3 Total score (0-10 with 10 being normal): 10 IMPRESSION: No acute intracranial pathology. Findings communicated with Dr. Derry Lory at 10:45 am. Electronically Signed   By: Lesia Hausen M.D.   On: 10/24/2022 10:47     PERTINENT LAB RESULTS: CBC: Recent Labs    10/24/22 1017 10/24/22 1024 10/25/22 0614  WBC 8.4  --  7.9  HGB 14.0 14.3 13.7  HCT 43.0 42.0 41.1  PLT 212  --  222   CMET CMP     Component Value Date/Time   NA 140 10/25/2022 0614   K 3.5 10/25/2022 0614   CL 108 10/25/2022 0614   CO2 22 10/25/2022 0614   GLUCOSE 81 10/25/2022 0614   BUN 16 10/25/2022 0614   CREATININE 0.82 10/25/2022 0614   CREATININE 0.84 10/21/2012 1050   CALCIUM 9.1 10/25/2022 0614   PROT 6.8 10/25/2022 0614   ALBUMIN 3.8 10/25/2022 0614   AST 22 10/25/2022 0614   ALT 24 10/25/2022 0614   ALKPHOS 87 10/25/2022 0614   BILITOT 0.7 10/25/2022 0614   GFR 88.38 12/25/2015 0840   GFRNONAA >60 10/25/2022 0614    GFR Estimated Creatinine Clearance: 75.5 mL/min (by C-G formula based on SCr of 0.82 mg/dL). No results for input(s): "LIPASE", "AMYLASE" in the last 72 hours. No results for input(s): "CKTOTAL", "CKMB", "CKMBINDEX", "TROPONINI" in the last 72 hours. Invalid input(s): "POCBNP" No  results for input(s): "DDIMER" in the last 72 hours. Recent Labs    10/25/22 0614  HGBA1C 11.1*   Recent Labs    10/25/22 0614  CHOL 150  HDL 52  LDLCALC 83  TRIG 76  CHOLHDL 2.9   No results for input(s): "TSH", "T4TOTAL", "T3FREE", "THYROIDAB" in the last 72 hours.  Invalid input(s): "FREET3" No results for input(s): "VITAMINB12", "FOLATE", "FERRITIN", "TIBC", "IRON", "RETICCTPCT"  in the last 72 hours. Coags: Recent Labs    10/24/22 1017  INR 1.0   Microbiology: No results found for this or any previous visit (from the past 240 hour(s)).  FURTHER DISCHARGE INSTRUCTIONS:  Get Medicines reviewed and adjusted: Please take all your medications with you for your next visit with your Primary MD  Laboratory/radiological data: Please request your Primary MD to go over all hospital tests and procedure/radiological results at the follow up, please ask your Primary MD to get all Hospital records sent to his/her office.  In some cases, they will be blood work, cultures and biopsy results pending at the time of your discharge. Please request that your primary care M.D. goes through all the records of your hospital data and follows up on these results.  Also Note the following: If you experience worsening of your admission symptoms, develop shortness of breath, life threatening emergency, suicidal or homicidal thoughts you must seek medical attention immediately by calling 911 or calling your MD immediately  if symptoms less severe.  You must read complete instructions/literature along with all the possible adverse reactions/side effects for all the Medicines you take and that have been prescribed to you. Take any new Medicines after you have completely understood and accpet all the possible adverse reactions/side effects.   Do not drive when taking Pain medications or sleeping medications (Benzodaizepines)  Do not take more than prescribed Pain, Sleep and Anxiety Medications. It is  not advisable to combine anxiety,sleep and pain medications without talking with your primary care practitioner  Special Instructions: If you have smoked or chewed Tobacco  in the last 2 yrs please stop smoking, stop any regular Alcohol  and or any Recreational drug use.  Wear Seat belts while driving.  Please note: You were cared for by a hospitalist during your hospital stay. Once you are discharged, your primary care physician will handle any further medical issues. Please note that NO REFILLS for any discharge medications will be authorized once you are discharged, as it is imperative that you return to your primary care physician (or establish a relationship with a primary care physician if you do not have one) for your post hospital discharge needs so that they can reassess your need for medications and monitor your lab values.  Total Time spent coordinating discharge including counseling, education and face to face time equals greater than 30 minutes.  SignedJeoffrey Massed 10/25/2022 9:35 AM

## 2022-10-25 NOTE — Progress Notes (Signed)
  Echocardiogram 2D Echocardiogram has been performed.  Karen Foley Wynn Banker 10/25/2022, 8:21 AM

## 2022-10-25 NOTE — Evaluation (Signed)
Occupational Therapy Evaluation and Discharge Patient Details Name: Karen Foley MRN: [id number] DOB: April 03, 1966 Today's Date: 10/25/2022   History of Present Illness Pt is a 57 y.o. female who presented 10/24/22 with R-sided weakness. MRI showed a small acute infarct in the low left internal capsule. PMH significant for anxiety/depression, DM, HTN, HLD, OSA on CPAP, Seizures as a child, asthma, scoliosis   Clinical Impression   At baseline, pt completes all ADLs, IADLs, and functional mobility Independent without an AD. Pt works outside the home and drives. Pt now presents at baseline with B UE strength/ROM WFL, vision at baseline, no noted balance deficits, cognition WNL, B UE coordination WNL, and with pt demonstrating ability to complete UB/LB ADLs and functional mobility Independent without an AD. Pt reports she feels she is at baseline and reports no concerns. No additional acute skilled OT services are indicated at this time. Post acute discharge, no OT follow up is indicated at this time OT is signing off.      Recommendations for follow up therapy are one component of a multi-disciplinary discharge planning process, led by the attending physician.  Recommendations may be updated based on patient status, additional functional criteria and insurance authorization.   Assistance Recommended at Discharge None  Patient can return home with the following      Functional Status Assessment  Patient has not had a recent decline in their functional status  Equipment Recommendations  None recommended by OT    Recommendations for Other Services       Precautions / Restrictions Precautions Precautions: None Restrictions Weight Bearing Restrictions: No      Mobility Bed Mobility Overal bed mobility: Modified Independent             General bed mobility comments: HOB elevated, mildly increased time    Transfers Overall transfer level: Independent Equipment used: None                General transfer comment: No assistance needed, no LOB      Balance Overall balance assessment: No apparent balance deficits (not formally assessed)                                         ADL either performed or assessed with clinical judgement   ADL Overall ADL's : Independent;At baseline                                             Vision Baseline Vision/History: 1 Wears glasses (progressives) Ability to See in Adequate Light: 0 Adequate Patient Visual Report: No change from baseline Vision Assessment?: No apparent visual deficits     Perception Perception Perception Tested?: Yes Comments: WFL   Praxis Praxis Praxis tested?: Within functional limits    Pertinent Vitals/Pain       Hand Dominance Right   Extremity/Trunk Assessment Upper Extremity Assessment Upper Extremity Assessment: Overall WFL for tasks assessed (No noted differences between R and L UE)   Lower Extremity Assessment Lower Extremity Assessment: Defer to PT evaluation   Cervical / Trunk Assessment Cervical / Trunk Assessment: Normal   Communication Communication Communication: No difficulties   Cognition Arousal/Alertness: Awake/alert Behavior During Therapy: WFL for tasks assessed/performed Overall Cognitive Status: Within Functional Limits for tasks assessed  General Comments  VSS on RA throughout session.    Exercises     Shoulder Instructions      Home Living Family/patient expects to be discharged to:: Private residence Living Arrangements: Alone Available Help at Discharge: Friend(s);Available PRN/intermittently Type of Home: Other(Comment) (Apartment within larger house) Home Access: Stairs to enter [employer] of Steps: 15 Entrance Stairs-Rails: Can reach both Home Layout: One level     Bathroom Shower/Tub: Chief Strategy Officer: Standard      Home Equipment: None          Prior Functioning/Environment Prior Level of Function : Driving;Working/employed;Independent/Modified Independent             Mobility Comments: No AD ADLs Comments: Independent, works as a Optometrist Problem List:        OT Treatment/Interventions:      OT Goals(Current goals can be found in the care plan section) Acute Rehab OT Goals Patient Stated Goal: to return to home and work  OT Frequency:      Co-evaluation              AM-PAC OT "6 Clicks" Daily Activity     Outcome Measure Help from another person eating meals?: None Help from another person taking care of personal grooming?: None Help from another person toileting, which includes using toliet, bedpan, or urinal?: None Help from another person bathing (including washing, rinsing, drying)?: None Help from another person to put on and taking off regular upper body clothing?: None Help from another person to put on and taking off regular lower body clothing?: None 6 Click Score: 24   End of Session Nurse Communication: Mobility status;Other (comment) (OT signing off)  Activity Tolerance: Patient tolerated treatment well Patient left: in bed;with call bell/phone within reach                   Time: 1610-9604 OT Time Calculation (min): 16 min Charges:  OT General Charges $OT Visit: 1 Visit OT Evaluation $OT Eval Low Complexity: 1 Low  Amaru Burroughs "Orson Eva., OTR/L, MA Acute Rehab [phone]   Lendon Colonel 10/25/2022, 9:42 AM

## 2023-02-04 ENCOUNTER — Inpatient Hospital Stay: Payer: 59 | Admitting: Neurology

## 2023-02-19 ENCOUNTER — Inpatient Hospital Stay: Payer: 59 | Admitting: Neurology

## 2023-05-25 ENCOUNTER — Telehealth: Payer: Self-pay | Admitting: Neurology

## 2023-05-25 NOTE — Telephone Encounter (Signed)
 Called the pt in regard to apt for tomorrow am, there was no answer. LVM advising that due to the weather our office will be opening late and that we will need to reschedule the appt. Instructed the patient to call back after 10 am tomorrow for us  to get rescheduled.

## 2023-05-26 ENCOUNTER — Inpatient Hospital Stay: Payer: 59 | Admitting: Neurology

## 2023-05-26 NOTE — Telephone Encounter (Signed)
 LVM  for pt to reschedule appointment from this am  Dr Pearlean Brownie has agreed to reschedule in am slots .
